# Patient Record
Sex: Male | Born: 1965
Health system: Southern US, Community
[De-identification: ages and names within clinical notes are randomized; demographics above are authoritative.]

## PROBLEM LIST (undated history)

## (undated) DIAGNOSIS — Z9889 Other specified postprocedural states: Secondary | ICD-10-CM

## (undated) DIAGNOSIS — D759 Disease of blood and blood-forming organs, unspecified: Secondary | ICD-10-CM

## (undated) DIAGNOSIS — R112 Nausea with vomiting, unspecified: Secondary | ICD-10-CM

## (undated) DIAGNOSIS — M1990 Unspecified osteoarthritis, unspecified site: Secondary | ICD-10-CM

## (undated) HISTORY — PX: ANKLE ARTHROSCOPY: SUR85

---

## 2004-05-13 ENCOUNTER — Emergency Department (HOSPITAL_COMMUNITY): Admission: AC | Admit: 2004-05-13 | Discharge: 2004-05-13 | Payer: Self-pay

## 2004-05-20 ENCOUNTER — Emergency Department (HOSPITAL_COMMUNITY): Admission: EM | Admit: 2004-05-20 | Discharge: 2004-05-20 | Payer: Self-pay | Admitting: Family Medicine

## 2009-11-28 ENCOUNTER — Encounter (INDEPENDENT_AMBULATORY_CARE_PROVIDER_SITE_OTHER): Payer: Self-pay | Admitting: *Deleted

## 2010-07-15 ENCOUNTER — Emergency Department (HOSPITAL_COMMUNITY)
Admission: EM | Admit: 2010-07-15 | Discharge: 2010-07-15 | Payer: Self-pay | Source: Home / Self Care | Admitting: Emergency Medicine

## 2010-08-22 NOTE — Letter (Signed)
Summary: New Patient letter  [facility]  [address] [place], [place] [address]   Phone: [phone]  Fax: [phone]       11/28/2009 MRN: [id number]  ERIC Cantero [address] [place], [place]  [address]  Dear Mr. [facility],  Welcome to the Gastroenterology Division at [facility].    You are scheduled to see Dr.  Christella Hartigan on 01-03-10 at 3pm on the 3rd floor at [facility], [address]  We ask that you try to arrive at our office 15 minutes prior to your appointment time to allow for check-in.  We would like you to complete the enclosed self-administered evaluation form prior to your visit and bring it with you on the day of your appointment.  We will review it with you.  Also, please bring a complete list of all your medications or, if you prefer, bring the medication bottles and we will list them.  Please bring your insurance card so that we may make a copy of it.  If your insurance requires a referral to see a specialist, please bring your referral form from your primary care physician.  Co-payments are due at the time of your visit and may be paid by cash, check or credit card.     Your office visit will consist of a consult with your physician (includes a physical exam), any laboratory testing he/she may order, scheduling of any necessary diagnostic testing (e.g. x-ray, ultrasound, CT-scan), and scheduling of a procedure (e.g. Endoscopy, Colonoscopy) if required.  Please allow enough time on your schedule to allow for any/all of these possibilities.    If you cannot keep your appointment, please call [phone] to cancel or reschedule prior to your appointment date.  This allows [place] the opportunity to schedule an appointment for another patient in need of care.  If you do not cancel or reschedule by 5 p.m. the business day prior to your appointment date, you will be charged a $50.00 late cancellation/no-show fee.    Thank you for choosing  [facility] for your medical needs.  We appreciate the opportunity to care for you.  Please visit [place] at our website  to learn more about our practice.                     Sincerely,                                                             The Gastroenterology Division

## 2010-12-19 ENCOUNTER — Other Ambulatory Visit (HOSPITAL_COMMUNITY): Payer: Self-pay | Admitting: Orthopaedic Surgery

## 2010-12-19 ENCOUNTER — Ambulatory Visit (HOSPITAL_COMMUNITY)
Admission: RE | Admit: 2010-12-19 | Discharge: 2010-12-19 | Disposition: A | Discharge status: Home / Self Care | Payer: 59 | Source: Ambulatory Visit | Attending: Orthopaedic Surgery | Admitting: Orthopaedic Surgery

## 2010-12-19 DIAGNOSIS — M19079 Primary osteoarthritis, unspecified ankle and foot: Secondary | ICD-10-CM | POA: Insufficient documentation

## 2010-12-19 DIAGNOSIS — R52 Pain, unspecified: Secondary | ICD-10-CM

## 2010-12-19 DIAGNOSIS — M25579 Pain in unspecified ankle and joints of unspecified foot: Secondary | ICD-10-CM | POA: Insufficient documentation

## 2011-07-10 ENCOUNTER — Encounter (HOSPITAL_COMMUNITY): Payer: Self-pay

## 2011-07-20 NOTE — Progress Notes (Signed)
Called Dr Victorino Dike for orders; in surgery today; will enter orders in Epic; is aware of   pat appt 12/31 @ 0900

## 2011-07-23 ENCOUNTER — Encounter (HOSPITAL_COMMUNITY)
Admission: RE | Admit: 2011-07-23 | Discharge: 2011-07-23 | Disposition: A | Discharge status: Home / Self Care | Payer: 59 | Source: Ambulatory Visit | Attending: Orthopedic Surgery | Admitting: Orthopedic Surgery

## 2011-07-23 ENCOUNTER — Encounter (HOSPITAL_COMMUNITY): Payer: Self-pay

## 2011-07-23 HISTORY — DX: Other specified postprocedural states: Z98.890

## 2011-07-23 HISTORY — DX: Nausea with vomiting, unspecified: R11.2

## 2011-07-23 HISTORY — DX: Disease of blood and blood-forming organs, unspecified: D75.9

## 2011-07-23 HISTORY — DX: Unspecified osteoarthritis, unspecified site: M19.90

## 2011-07-23 LAB — TYPE AND SCREEN: ABO/RH(D): B POS

## 2011-07-23 LAB — BASIC METABOLIC PANEL
BUN: 16 mg/dL (ref 6–23)
CO2: 27 mEq/L (ref 19–32)
Calcium: 8.8 mg/dL (ref 8.4–10.5)
Chloride: 105 mEq/L (ref 96–112)
Creatinine, Ser: 0.86 mg/dL (ref 0.50–1.35)
GFR calc Af Amer: >90 mL/min (ref >90)
GFR calc non Af Amer: >90 mL/min (ref >90)
Glucose, Bld: 97 mg/dL (ref 70–99)
Potassium: 4 mEq/L (ref 3.5–5.1)
Sodium: 140 mEq/L (ref 135–145)

## 2011-07-23 LAB — CBC
MCV: 82.1 fL (ref 78.0–100.0)
Platelets: 136 10*3/uL — ABNORMAL LOW (ref 150–400)
RBC: 4.48 MIL/uL (ref 4.22–5.81)
RDW: 13.5 % (ref 11.5–15.5)
WBC: 4.4 10*3/uL (ref 4.0–10.5)

## 2011-07-23 LAB — ABO/RH: ABO/RH(D): B POS

## 2011-07-23 LAB — SURGICAL PCR SCREEN
MRSA, PCR: NEGATIVE
Staphylococcus aureus: POSITIVE — AB

## 2011-07-23 MED ORDER — CHLORHEXIDINE GLUCONATE 4 % EX LIQD: 60.0000 mL | Freq: Once | CUTANEOUS | Status: DC

## 2011-07-23 NOTE — Pre-Procedure Instructions (Addendum)
20 RAMIL EDGINGTON  07/23/2011   Your procedure is scheduled on: 07/26/11  Report to [facility] [id number] AM.  Call this number if you have problems the morning of surgery: [phone]   Remember:   Do not eat food:After Midnight.  May have clear liquids: up to 4 Hours before arrival.  Clear liquids include soda, tea, black coffee, apple or grape juice, broth.  Take these medicines the morning of surgery with A SIP OF WATER: tylenol   Do not wear jewelry, make-up or nail polish.  Do not wear lotions, powders, or perfumes. You may wear deodorant.  Do not shave 48 hours prior to surgery.  Do not bring valuables to the hospital.  Contacts, dentures or bridgework may not be worn into surgery.  Leave suitcase in the car. After surgery it may be brought to your room.  For patients admitted to the hospital, checkout time is 11:00 AM the day of discharge.   Patients discharged the day of surgery will not be allowed to drive home.  Name and phone number of your driver: nhi [phone]  Special Instructions: CHG Shower Use Special Wash: 1/2 bottle night before surgery and 1/2 bottle morning of surgery.incentive spirometry instructions   Please read over the following fact sheets that you were given: Pain Booklet, Coughing and Deep Breathing, MRSA Information and Surgical Site Infection Prevention

## 2011-07-25 MED ORDER — CEFAZOLIN SODIUM-DEXTROSE 2-3 GM-% IV SOLR
2.0000 g | INTRAVENOUS | Status: AC
  Administered 2011-07-26: 2 g via INTRAVENOUS
  Filled 2011-07-25: qty 50

## 2011-07-26 ENCOUNTER — Ambulatory Visit (HOSPITAL_COMMUNITY): Payer: 59 | Admitting: Critical Care Medicine

## 2011-07-26 ENCOUNTER — Encounter (HOSPITAL_COMMUNITY): Payer: Self-pay | Admitting: *Deleted

## 2011-07-26 ENCOUNTER — Encounter (HOSPITAL_COMMUNITY): Payer: Self-pay | Admitting: Critical Care Medicine

## 2011-07-26 ENCOUNTER — Inpatient Hospital Stay (HOSPITAL_COMMUNITY)
Admission: RE | Admit: 2011-07-26 | Discharge: 2011-07-28 | DRG: 469 | Disposition: A | Discharge status: Home Health | Payer: 59 | Source: Ambulatory Visit | Attending: Orthopedic Surgery | Admitting: Orthopedic Surgery

## 2011-07-26 ENCOUNTER — Ambulatory Visit (HOSPITAL_COMMUNITY): Payer: 59

## 2011-07-26 ENCOUNTER — Encounter (HOSPITAL_COMMUNITY)
Admission: RE | Disposition: A | Discharge status: Home Health | Payer: Self-pay | Source: Ambulatory Visit | Attending: Orthopedic Surgery

## 2011-07-26 DIAGNOSIS — D66 Hereditary factor VIII deficiency: Secondary | ICD-10-CM | POA: Diagnosis present

## 2011-07-26 DIAGNOSIS — Z01812 Encounter for preprocedural laboratory examination: Secondary | ICD-10-CM

## 2011-07-26 DIAGNOSIS — M19079 Primary osteoarthritis, unspecified ankle and foot: Secondary | ICD-10-CM

## 2011-07-26 DIAGNOSIS — M12879 Other specific arthropathies, not elsewhere classified, unspecified ankle and foot: Principal | ICD-10-CM | POA: Diagnosis present

## 2011-07-26 DIAGNOSIS — Z79899 Other long term (current) drug therapy: Secondary | ICD-10-CM

## 2011-07-26 HISTORY — PX: TOTAL ANKLE ARTHROPLASTY: SHX811

## 2011-07-26 SURGERY — ARTHROPLASTY, ANKLE, TOTAL
Anesthesia: General | Site: Ankle | Laterality: Left | Wound class: Clean

## 2011-07-26 MED ORDER — SENNA 8.6 MG PO TABS
1.0000 | ORAL_TABLET | Freq: Two times a day (BID) | ORAL | Status: DC
  Administered 2011-07-26 – 2011-07-27 (×2): 8.6 mg via ORAL
  Filled 2011-07-26 (×5): qty 1

## 2011-07-26 MED ORDER — MORPHINE SULFATE 2 MG/ML IJ SOLN: 0.0500 mg/kg | INTRAMUSCULAR | Status: DC | PRN

## 2011-07-26 MED ORDER — HYDROMORPHONE HCL PF 1 MG/ML IJ SOLN
1.0000 mg | Freq: Two times a day (BID) | INTRAMUSCULAR | Status: DC | PRN
  Administered 2011-07-26: 0.5 mg via INTRAVENOUS
  Filled 2011-07-26 (×2): qty 1

## 2011-07-26 MED ORDER — PROPOFOL 10 MG/ML IV EMUL
INTRAVENOUS | Status: DC | PRN
  Administered 2011-07-26: 200 mg via INTRAVENOUS

## 2011-07-26 MED ORDER — ONDANSETRON HCL 4 MG PO TABS: 4.0000 mg | ORAL_TABLET | Freq: Four times a day (QID) | ORAL | Status: DC | PRN

## 2011-07-26 MED ORDER — SODIUM CHLORIDE 0.9 % IV SOLN: INTRAVENOUS | Status: DC

## 2011-07-26 MED ORDER — ZOLPIDEM TARTRATE 5 MG PO TABS
5.0000 mg | ORAL_TABLET | Freq: Every evening | ORAL | Status: DC | PRN
  Administered 2011-07-26: 5 mg via ORAL
  Filled 2011-07-26: qty 1

## 2011-07-26 MED ORDER — METOCLOPRAMIDE HCL 10 MG PO TABS
5.0000 mg | ORAL_TABLET | Freq: Three times a day (TID) | ORAL | Status: DC | PRN
  Administered 2011-07-27: 10 mg via ORAL
  Filled 2011-07-26: qty 1

## 2011-07-26 MED ORDER — SCOPOLAMINE 1 MG/3DAYS TD PT72
MEDICATED_PATCH | TRANSDERMAL | Status: DC | PRN
  Administered 2011-07-26: 1.5 mg via TRANSDERMAL

## 2011-07-26 MED ORDER — METHOCARBAMOL 500 MG PO TABS
500.0000 mg | ORAL_TABLET | Freq: Four times a day (QID) | ORAL | Status: DC | PRN
  Administered 2011-07-26 – 2011-07-28 (×3): 500 mg via ORAL
  Filled 2011-07-26 (×3): qty 1

## 2011-07-26 MED ORDER — ACETAMINOPHEN 10 MG/ML IV SOLN
INTRAVENOUS | Status: AC
  Filled 2011-07-26: qty 100

## 2011-07-26 MED ORDER — ROCURONIUM BROMIDE 100 MG/10ML IV SOLN
INTRAVENOUS | Status: DC | PRN
  Administered 2011-07-26: 50 mg via INTRAVENOUS

## 2011-07-26 MED ORDER — LIDOCAINE HCL (CARDIAC) 20 MG/ML IV SOLN
INTRAVENOUS | Status: DC | PRN
  Administered 2011-07-26: 60 mg via INTRAVENOUS

## 2011-07-26 MED ORDER — COAGULATION FACTOR IX (RECOMB) 250 UNITS IV SOLR
5148.0000 [IU] | Freq: Once | INTRAVENOUS | Status: AC
  Administered 2011-07-26: 5148 [IU] via INTRAVENOUS
  Filled 2011-07-26: qty 5148

## 2011-07-26 MED ORDER — VANCOMYCIN HCL 500 MG IV SOLR
500.0000 mg | INTRAVENOUS | Status: DC
  Filled 2011-07-26: qty 500

## 2011-07-26 MED ORDER — COAGULATION FACTOR IX (RECOMB) 250 UNITS IV SOLR
5000.0000 [IU] | Freq: Once | INTRAVENOUS | Status: DC
  Filled 2011-07-26: qty 5000

## 2011-07-26 MED ORDER — OXYCODONE HCL 5 MG PO TABS
5.0000 mg | ORAL_TABLET | ORAL | Status: DC | PRN
  Administered 2011-07-26 – 2011-07-28 (×4): 10 mg via ORAL
  Filled 2011-07-26 (×4): qty 2

## 2011-07-26 MED ORDER — ACETAMINOPHEN 10 MG/ML IV SOLN
INTRAVENOUS | Status: DC | PRN
  Administered 2011-07-26: 1000 mg via INTRAVENOUS

## 2011-07-26 MED ORDER — VECURONIUM BROMIDE 10 MG IV SOLR
INTRAVENOUS | Status: DC | PRN
  Administered 2011-07-26 (×2): 2 mg via INTRAVENOUS

## 2011-07-26 MED ORDER — DOCUSATE SODIUM 100 MG PO CAPS
100.0000 mg | ORAL_CAPSULE | Freq: Two times a day (BID) | ORAL | Status: DC
  Administered 2011-07-26 – 2011-07-28 (×3): 100 mg via ORAL
  Filled 2011-07-26 (×6): qty 1

## 2011-07-26 MED ORDER — DEXAMETHASONE SODIUM PHOSPHATE 4 MG/ML IJ SOLN
INTRAMUSCULAR | Status: DC | PRN
  Administered 2011-07-26: 4 mg via INTRAVENOUS

## 2011-07-26 MED ORDER — OXYCODONE HCL 20 MG PO TB12
20.0000 mg | ORAL_TABLET | Freq: Two times a day (BID) | ORAL | Status: DC
  Administered 2011-07-26 – 2011-07-28 (×3): 20 mg via ORAL
  Filled 2011-07-26 (×3): qty 1

## 2011-07-26 MED ORDER — MIDAZOLAM HCL 2 MG/2ML IJ SOLN: 0.5000 mg | Freq: Once | INTRAMUSCULAR | Status: DC | PRN

## 2011-07-26 MED ORDER — NEOSTIGMINE METHYLSULFATE 1 MG/ML IJ SOLN
INTRAMUSCULAR | Status: DC | PRN
  Administered 2011-07-26: 5 mg via INTRAVENOUS

## 2011-07-26 MED ORDER — MEPERIDINE HCL 25 MG/ML IJ SOLN: 6.2500 mg | INTRAMUSCULAR | Status: DC | PRN

## 2011-07-26 MED ORDER — FENTANYL CITRATE 0.05 MG/ML IJ SOLN
INTRAMUSCULAR | Status: DC | PRN
  Administered 2011-07-26 (×6): 50 ug via INTRAVENOUS
  Administered 2011-07-26 (×2): 100 ug via INTRAVENOUS
  Administered 2011-07-26: 50 ug via INTRAVENOUS
  Administered 2011-07-26: 100 ug via INTRAVENOUS

## 2011-07-26 MED ORDER — VANCOMYCIN HCL 500 MG IV SOLR
INTRAVENOUS | Status: DC | PRN
  Administered 2011-07-26: 500 mg via TOPICAL

## 2011-07-26 MED ORDER — MIDAZOLAM HCL 5 MG/5ML IJ SOLN
INTRAMUSCULAR | Status: DC | PRN
  Administered 2011-07-26: 2 mg via INTRAVENOUS

## 2011-07-26 MED ORDER — SODIUM CHLORIDE 0.9 % IV SOLN
INTRAVENOUS | Status: DC
  Administered 2011-07-26 – 2011-07-28 (×2): via INTRAVENOUS

## 2011-07-26 MED ORDER — HYDROMORPHONE HCL PF 1 MG/ML IJ SOLN
0.2500 mg | INTRAMUSCULAR | Status: DC | PRN
Start: 2011-07-26 — End: 2011-07-26
  Administered 2011-07-26: 0.5 mg via INTRAVENOUS

## 2011-07-26 MED ORDER — PROMETHAZINE HCL 25 MG/ML IJ SOLN: 6.2500 mg | INTRAMUSCULAR | Status: DC | PRN

## 2011-07-26 MED ORDER — 0.9 % SODIUM CHLORIDE (POUR BTL) OPTIME
TOPICAL | Status: DC | PRN
  Administered 2011-07-26: 1000 mL

## 2011-07-26 MED ORDER — ONDANSETRON HCL 4 MG/2ML IJ SOLN
INTRAMUSCULAR | Status: DC | PRN
  Administered 2011-07-26 (×2): 4 mg via INTRAVENOUS

## 2011-07-26 MED ORDER — COAGULATION FACTOR IX (RECOMB) 250 UNITS IV SOLR
5000.0000 [IU] | Freq: Two times a day (BID) | INTRAVENOUS | Status: DC
  Administered 2011-07-27: 5000 [IU] via INTRAVENOUS
  Filled 2011-07-26 (×5): qty 5000

## 2011-07-26 MED ORDER — METOCLOPRAMIDE HCL 5 MG/ML IJ SOLN
5.0000 mg | Freq: Three times a day (TID) | INTRAMUSCULAR | Status: DC | PRN
  Filled 2011-07-26: qty 2

## 2011-07-26 MED ORDER — ONDANSETRON HCL 4 MG/2ML IJ SOLN
4.0000 mg | Freq: Four times a day (QID) | INTRAMUSCULAR | Status: DC | PRN
  Administered 2011-07-27: 4 mg via INTRAVENOUS
  Filled 2011-07-26: qty 2

## 2011-07-26 MED ORDER — LACTATED RINGERS IV SOLN
INTRAVENOUS | Status: DC
  Administered 2011-07-26 (×2): via INTRAVENOUS

## 2011-07-26 MED ORDER — DROPERIDOL 2.5 MG/ML IJ SOLN
INTRAMUSCULAR | Status: DC | PRN
  Administered 2011-07-26: 0.625 mg via INTRAVENOUS

## 2011-07-26 MED ORDER — ACETAMINOPHEN 325 MG PO TABS: 650.0000 mg | ORAL_TABLET | ORAL | Status: DC | PRN

## 2011-07-26 MED ORDER — GLYCOPYRROLATE 0.2 MG/ML IJ SOLN
INTRAMUSCULAR | Status: DC | PRN
  Administered 2011-07-26: .8 mg via INTRAVENOUS

## 2011-07-26 MED ORDER — METHOCARBAMOL 100 MG/ML IJ SOLN
500.0000 mg | Freq: Four times a day (QID) | INTRAVENOUS | Status: DC | PRN
  Filled 2011-07-26: qty 5

## 2011-07-26 SURGICAL SUPPLY — 62 items
BANDAGE ESMARK 6X9 LF (GAUZE/BANDAGES/DRESSINGS) ×1 IMPLANT
BLADE LONG MED 31X9 (MISCELLANEOUS) ×2 IMPLANT
BLADE RECIPRO TAPERED (BLADE) ×2 IMPLANT
BLADE SURG 15 STRL LF DISP TIS (BLADE) ×2 IMPLANT
BLADE SURG 15 STRL SS (BLADE) ×4
BNDG CMPR 9X6 STRL LF SNTH (GAUZE/BANDAGES/DRESSINGS) ×1
BNDG ESMARK 6X9 LF (GAUZE/BANDAGES/DRESSINGS) ×2
CHLORAPREP W/TINT 26ML (MISCELLANEOUS) ×2 IMPLANT
CLOTH BEACON ORANGE TIMEOUT ST (SAFETY) ×2 IMPLANT
CORE SLIDING STAR SZ 9MM (Orthopedic Implant) ×1 IMPLANT
COVER SURGICAL LIGHT HANDLE (MISCELLANEOUS) ×2 IMPLANT
CUFF TOURNIQUET SINGLE 34IN LL (TOURNIQUET CUFF) ×2 IMPLANT
CUFF TOURNIQUET SINGLE 44IN (TOURNIQUET CUFF) IMPLANT
DRAPE C-ARM 42X72 X-RAY (DRAPES) ×2 IMPLANT
DRAPE C-ARMOR (DRAPES) ×1 IMPLANT
DRAPE EXTREMITY T 121X128X90 (DRAPE) ×2 IMPLANT
DRAPE ORTHO SPLIT 77X108 STRL (DRAPES) ×2
DRAPE SURG ORHT 6 SPLT 77X108 (DRAPES) ×1 IMPLANT
DRAPE U-SHAPE 47X51 STRL (DRAPES) ×2 IMPLANT
DRSG ADAPTIC 3X8 NADH LF (GAUZE/BANDAGES/DRESSINGS) ×2 IMPLANT
DRSG MEPILEX BORDER 4X4 (GAUZE/BANDAGES/DRESSINGS) ×2 IMPLANT
DRSG PAD ABDOMINAL 8X10 ST (GAUZE/BANDAGES/DRESSINGS) ×6 IMPLANT
ELECT REM PT RETURN 9FT ADLT (ELECTROSURGICAL) ×2
ELECTRODE REM PT RTRN 9FT ADLT (ELECTROSURGICAL) ×1 IMPLANT
EVACUATOR 1/8 PVC DRAIN (DRAIN) ×2 IMPLANT
GAUZE SPONGE 4X4 12PLY STRL LF (GAUZE/BANDAGES/DRESSINGS) ×1 IMPLANT
GEL ULTRASOUND 20GR AQUASONIC (MISCELLANEOUS) ×2 IMPLANT
GLOVE BIO SURGEON STRL SZ7 (GLOVE) ×2 IMPLANT
GLOVE BIO SURGEON STRL SZ8 (GLOVE) ×2 IMPLANT
GLOVE BIOGEL PI IND STRL 6.5 (GLOVE) IMPLANT
GLOVE BIOGEL PI IND STRL 8 (GLOVE) ×1 IMPLANT
GLOVE BIOGEL PI INDICATOR 6.5 (GLOVE) ×1
GLOVE BIOGEL PI INDICATOR 8 (GLOVE) ×1
GLOVE ORTHO TXT STRL SZ7.5 (GLOVE) ×1 IMPLANT
GOWN PREVENTION PLUS XLARGE (GOWN DISPOSABLE) ×2 IMPLANT
GOWN STRL NON-REIN LRG LVL3 (GOWN DISPOSABLE) ×4 IMPLANT
IMPLANT TALAR STAR SZ XS LF (Orthopedic Implant) ×1 IMPLANT
IMPLANT TIBIAL STAR SZ L (Orthopedic Implant) ×1 IMPLANT
KIT BASIN OR (CUSTOM PROCEDURE TRAY) ×2 IMPLANT
KIT ROOM TURNOVER OR (KITS) ×2 IMPLANT
MANIFOLD NEPTUNE II (INSTRUMENTS) ×2 IMPLANT
NS IRRIG 1000ML POUR BTL (IV SOLUTION) ×2 IMPLANT
PACK TOTAL JOINT (CUSTOM PROCEDURE TRAY) ×2 IMPLANT
PAD ARMBOARD 7.5X6 YLW CONV (MISCELLANEOUS) ×4 IMPLANT
PAD CAST 4YDX4 CTTN HI CHSV (CAST SUPPLIES) ×3 IMPLANT
PADDING CAST COTTON 4X4 STRL (CAST SUPPLIES) ×6
PADDING WEBRIL 4 STERILE (GAUZE/BANDAGES/DRESSINGS) ×1 IMPLANT
PADDING WEBRIL 6 STERILE (GAUZE/BANDAGES/DRESSINGS) ×1 IMPLANT
SPLINT FIBERGLASS 5X30 (CAST SUPPLIES) ×1 IMPLANT
SPONGE GAUZE 4X4 12PLY (GAUZE/BANDAGES/DRESSINGS) ×4 IMPLANT
SUCTION FRAZIER TIP 10 FR DISP (SUCTIONS) ×2 IMPLANT
SUT MNCRL 3 0 RB1 (SUTURE) ×2 IMPLANT
SUT MONOCRYL 3 0 RB1 (SUTURE) ×2
SUT PROLENE 3 0 PS 2 (SUTURE) ×3 IMPLANT
SUT VIC AB 0 CT1 27 (SUTURE) ×2
SUT VIC AB 0 CT1 27XBRD ANBCTR (SUTURE) ×1 IMPLANT
SUT VIC AB 2-0 CT1 27 (SUTURE) ×2
SUT VIC AB 2-0 CT1 TAPERPNT 27 (SUTURE) ×2 IMPLANT
SUT VICRYL 4-0 PS2 18IN ABS (SUTURE) ×1 IMPLANT
TOWEL OR 17X24 6PK STRL BLUE (TOWEL DISPOSABLE) ×2 IMPLANT
TOWEL OR 17X26 10 PK STRL BLUE (TOWEL DISPOSABLE) ×2 IMPLANT
WATER STERILE IRR 1000ML POUR (IV SOLUTION) ×1 IMPLANT

## 2011-07-26 NOTE — Anesthesia Postprocedure Evaluation (Signed)
  Anesthesia Post-op Note  Patient: [name]  Procedure(s) Performed:  TOTAL ANKLE ARTHOPLASTY - Left Total Ankle Replacement   Patient Location: PACU  Anesthesia Type: General  Level of Consciousness: awake and alert   Airway and Oxygen Therapy: Patient Spontanous Breathing  Post-op Pain: mild  Post-op Assessment: Post-op Vital signs reviewed, Patient's Cardiovascular Status Stable and Respiratory Function Stable  Post-op Vital Signs: stable  Complications: No apparent anesthesia complications

## 2011-07-26 NOTE — Brief Op Note (Signed)
07/26/2011  3:51 PM  PATIENT:  [name]  45 y.o. male  PRE-OPERATIVE DIAGNOSIS:  left ankle arthritis  POST-OPERATIVE DIAGNOSIS:  left ankle arthritis  Procedure(s): 1.  Left TOTAL ANKLE ARTHOPLASTY 2.  Fluoro > 1 hr  SURGEON:  Toni Arthurs, MD  ASSISTANT: n/a  ANESTHESIA:   General  EBL: minimal  Implants:  STAR components:  Size L tibia, size XS talus, 9 mm poly.  TOURNIQUET:   Total Tourniquet Time Documented: Thigh (Left) - 135 minutes  COMPLICATIONS:  None apparent  DISPOSITION:  Extubated, awake and stable to recovery.  DICTATION ID:  [id number]

## 2011-07-26 NOTE — Anesthesia Preprocedure Evaluation (Addendum)
Anesthesia Evaluation  Patient identified by MRN, date of birth, ID band Patient awake    Reviewed: Allergy & Precautions, H&P , NPO status , Patient's Chart, lab work & pertinent test results  History of Anesthesia Complications (+) PONV  Airway Mallampati: II TM Distance: >3 FB Neck ROM: Full    Dental  (+) Teeth Intact and Dental Advisory Given   Pulmonary neg pulmonary ROS,  clear to auscultation        Cardiovascular neg cardio ROS Normal    Neuro/Psych Negative Neurological ROS     GI/Hepatic negative GI ROS, Neg liver ROS,   Endo/Other    Renal/GU negative Renal ROS     Musculoskeletal   Abdominal   Peds  Hematology Factor 9 deficiency   Anesthesia Other Findings   Reproductive/Obstetrics                        Anesthesia Physical Anesthesia Plan  ASA: II  Anesthesia Plan: General   Post-op Pain Management:    Induction: Intravenous  Airway Management Planned: Oral ETT  Additional Equipment:   Intra-op Plan:   Post-operative Plan: Extubation in OR  Informed Consent: I have reviewed the patients History and Physical, chart, labs and discussed the procedure including the risks, benefits and alternatives for the proposed anesthesia with the patient or authorized representative who has indicated his/her understanding and acceptance.   Dental advisory given  Plan Discussed with: CRNA, Anesthesiologist and Surgeon  Anesthesia Plan Comments:         Anesthesia Quick Evaluation

## 2011-07-26 NOTE — Anesthesia Procedure Notes (Signed)
Procedure Name: Intubation Date/Time: 07/26/2011 1:07 PM Performed by: Elon Alas Pre-anesthesia Checklist: Timeout performed, Patient identified, Emergency Drugs available, Suction available and Patient being monitored Patient Re-evaluated:Patient Re-evaluated prior to inductionOxygen Delivery Method: Circle System Utilized Preoxygenation: Pre-oxygenation with 100% oxygen Intubation Type: IV induction and Cricoid Pressure applied Ventilation: Mask ventilation without difficulty and Oral airway inserted - appropriate to patient size Laryngoscope Size: Mac and 4 Grade View: Grade II Tube type: Oral Tube size: 7.5 mm Number of attempts: 1 Airway Equipment and Method: stylet Placement Confirmation: ETT inserted through vocal cords under direct vision,  positive ETCO2 and breath sounds checked- equal and bilateral Secured at: 22 cm Tube secured with: Tape Dental Injury: Teeth and Oropharynx as per pre-operative assessment

## 2011-07-26 NOTE — H&P (Signed)
[name] is an 46 y.o. male.   Chief Complaint: left ankle pain HPI: 46 y/o male with pmh of hemophilia (factor 9 deficiency) presents with a long h/o left ankle pain due to arthritis.  He presents now for L total ankle replacement.  Past Medical History  Diagnosis Date  . PONV (postoperative nausea and vomiting)   . Blood dyscrasia     hemophiliac  . Arthritis     Past Surgical History  Procedure Date  . Ankle arthroscopy     lft ,rt x2    History reviewed. No pertinent family history. Social History:  reports that he has never smoked. He does not have any smokeless tobacco history on file. He reports that he does not drink alcohol or use illicit drugs.  Allergies: No Known Allergies  Medications Prior to Admission  Medication Dose Route Frequency Provider Last Rate Last Dose  . 0.9 %  sodium chloride infusion   Intravenous Continuous Toni Arthurs, MD      . ceFAZolin (ANCEF) IVPB 2 g/50 mL premix  2 g Intravenous 60 min Pre-Op Toni Arthurs, MD      . coagulation factor IX (recomb) (BENEFIX) injection 5,148 Units  5,148 Units Intravenous Once Sherron Monday, PHARMD      . lactated ringers infusion   Intravenous Continuous Judie Petit, MD 50 mL/hr at 08-12-11 1133    . vancomycin (VANCOCIN) powder 500 mg  500 mg Other To OR Toni Arthurs, MD      . DISCONTD: coagulation factor IX (recomb) (BENEFIX) injection 5,000 Units  5,000 Units Intravenous Once Toni Arthurs, MD       No current outpatient prescriptions on file as of 08/12/11.    No results found for this or any previous visit (from the past 48 hour(s)). No results found.  ROS  No recent f/c/n/v/wt loss.  Blood pressure 127/74, pulse 72, temperature 98.1 F (36.7 C), temperature source Oral, resp. rate 18, SpO2 98.00%. Physical Exam  wn wd male in nad.  A and O x 4.  Mood and affect normal. EOMI.  Respirations unlabored.  L ankle with small effusion and swelling.  Nl sens to LT.  Palpable pulses.  5/5  strength in PF and DF.  No lymphadenopathy.  xrays show normal alignment but end stage arthritis.  Assessment/Plan L ankle arthritis - to OR for L total ankle replacement  Hemophilia - BeneFix 5000 units pre op 30 min and then q12 x 3 doses post op per hematologist.  The risks and benefits of the alternative treatment options have been discussed in detail.  The patient wishes to proceed with surgery and specifically understands risks of bleeding, infection, nerve damage, blood clots, need for additional surgery, amputation and death.   Toni Arthurs 12-Aug-2011, 12:35 PM

## 2011-07-26 NOTE — Transfer of Care (Signed)
Immediate Anesthesia Transfer of Care Note  Patient: [name]  Procedure(s) Performed:  TOTAL ANKLE ARTHOPLASTY - Left Total Ankle Replacement   Patient Location: PACU  Anesthesia Type: General  Level of Consciousness: sedated  Airway & Oxygen Therapy: Patient connected to nasal cannula oxygen  Post-op Assessment: Report given to PACU RN, Post -op Vital signs reviewed and stable and Patient moving all extremities X 4  Post vital signs: Reviewed and stable  Complications: No apparent anesthesia complications

## 2011-07-27 ENCOUNTER — Encounter (HOSPITAL_COMMUNITY): Payer: Self-pay | Admitting: Orthopedic Surgery

## 2011-07-27 MED ORDER — HYDROMORPHONE HCL PF 1 MG/ML IJ SOLN
1.0000 mg | INTRAMUSCULAR | Status: DC | PRN
  Administered 2011-07-27 (×3): 1 mg via INTRAVENOUS
  Filled 2011-07-27 (×2): qty 1

## 2011-07-27 MED ORDER — HYDROMORPHONE HCL PF 1 MG/ML IJ SOLN
1.0000 mg | INTRAMUSCULAR | Status: DC | PRN
  Administered 2011-07-27 – 2011-07-28 (×6): 1 mg via INTRAVENOUS
  Filled 2011-07-27 (×7): qty 1

## 2011-07-27 MED ORDER — COAGULATION FACTOR IX (RECOMB) 250 UNITS IV SOLR
5250.0000 [IU] | Freq: Two times a day (BID) | INTRAVENOUS | Status: AC
  Administered 2011-07-28 (×2): 5250 [IU] via INTRAVENOUS
  Filled 2011-07-27 (×4): qty 5250

## 2011-07-27 MED ORDER — SODIUM CHLORIDE 0.9 % IJ SOLN: 10.0000 mL | INTRAMUSCULAR | Status: DC | PRN

## 2011-07-27 MED ORDER — COAGULATION FACTOR IX (RECOMB) 250 UNITS IV SOLR
5041.0000 [IU] | Freq: Two times a day (BID) | INTRAVENOUS | Status: DC
  Administered 2011-07-27: 5041 [IU] via INTRAVENOUS
  Filled 2011-07-27 (×4): qty 5041

## 2011-07-27 NOTE — Progress Notes (Signed)
Physical Therapy Evaluation Patient Details Name: [name] MRN: [id number] DOB: [birth year] Today's Date: 07/27/2011  Problem List: There is no problem list on file for this patient.   Past Medical History:  Past Medical History  Diagnosis Date  . PONV (postoperative nausea and vomiting)   . Blood dyscrasia     hemophiliac  . Arthritis    Past Surgical History:  Past Surgical History  Procedure Date  . Ankle arthroscopy     lft ,rt x2  . Total ankle arthroplasty 07/26/2011    Procedure: TOTAL ANKLE ARTHOPLASTY;  Surgeon: Toni Arthurs, MD;  Location: [facility] OR;  Service: Orthopedics;  Laterality: Left;  Left Total Ankle Replacement     PT Assessment/Plan/Recommendation PT Assessment Clinical Impression Statement: Pt presents with a medical diagnosis of  left total ankle replacement along with the following impairments/deficits and therapy diagnosis listed below. pt will benefit from skilled PT in the acute care setting in order to maximize functional mobility for a safe d/c home PT Recommendation/Assessment: Patient will need skilled PT in the acute care venue PT Problem List: Decreased strength;Decreased range of motion;Decreased activity tolerance;Decreased mobility;Decreased knowledge of use of DME;Decreased knowledge of precautions;Pain PT Therapy Diagnosis : Difficulty walking;Acute pain PT Plan PT Frequency: Min 5X/week PT Treatment/Interventions: DME instruction;Gait training;Stair training;Functional mobility training;Therapeutic activities;Therapeutic exercise;Patient/family education PT Recommendation Follow Up Recommendations: Home health PT;24 hour supervision/assistance Equipment Recommended: Rolling walker with 5" wheels PT Goals  Acute Rehab PT Goals PT Goal Formulation: With patient Time For Goal Achievement: 7 days Pt will go Supine/Side to Sit: with modified independence PT Goal: Supine/Side to Sit - Progress: Progressing toward goal Pt will go Sit to  Supine/Side: with modified independence PT Goal: Sit to Supine/Side - Progress: Progressing toward goal Pt will go Sit to Stand: with modified independence PT Goal: Sit to Stand - Progress: Progressing toward goal Pt will go Stand to Sit: with modified independence PT Goal: Stand to Sit - Progress: Progressing toward goal Pt will Ambulate: 16 - 50 feet;with supervision;with least restrictive assistive device PT Goal: Ambulate - Progress: Progressing toward goal Pt will Go Up / Down Stairs: 1-2 stairs;with min assist;with rolling walker PT Goal: Up/Down Stairs - Progress: Not met  PT Evaluation Precautions/Restrictions  Restrictions Weight Bearing Restrictions: Yes LLE Weight Bearing: Non weight bearing Prior Functioning  Home Living Lives With: Spouse Receives Help From: Family Type of Home: House Home Layout: One level Home Access: Stairs to enter Entrance Stairs-Rails: None Entrance Stairs-Number of Steps: 1 Bathroom Shower/Tub: Forensic scientist: Standard Bathroom Accessibility: Yes How Accessible: Accessible via walker Home Adaptive Equipment: Shower chair with back Prior Function Level of Independence: Independent with basic ADLs;Independent with gait;Independent with transfers;Independent with homemaking with ambulation Able to Take Stairs?: Yes Driving: Yes Vocation: Full time employment Cognition Cognition Arousal/Alertness: Awake/alert Overall Cognitive Status: Appears within functional limits for tasks assessed Orientation Level: Oriented X4 Sensation/Coordination Sensation Light Touch: Appears Intact Extremity Assessment RLE Assessment RLE Assessment: Within Functional Limits LLE Assessment LLE Assessment: Exceptions to WFL LLE AROM (degrees) Overall AROM Left Lower Extremity: Deficits;Due to pain;Due to precautions (Hip and Knee WFL; Ankle in cast) LLE Strength LLE Overall Strength: Deficits;Due to pain;Due to precautions (Hip and  Knee WFL; Ankle in cast) Mobility (including Balance) Bed Mobility Bed Mobility: Yes Supine to Sit: 4: Min assist;HOB elevated (Comment degrees) (40) Supine to Sit Details (indicate cue type and reason): VC for hand placement. Min assist for assist with LLE into sitting Sitting - Scoot  to Edge of Bed: 6: Modified independent (Device/Increase time) Sit to Supine - Right: 5: Supervision Sit to Supine - Right Details (indicate cue type and reason): VC for technique Transfers Transfers: Yes Sit to Stand: With upper extremity assist;From bed;3: Mod assist Sit to Stand Details (indicate cue type and reason): Assist to stabilize pts BOS anteriorly in order to assist with standing. Once pt in standing, he was able to stabilize on RW Stand to Sit: 4: Min assist;1: +2 Total assist;With upper extremity assist;To bed Stand to Sit Details: VC for hand placement and safety with RW Ambulation/Gait Ambulation/Gait: Yes Ambulation/Gait Assistance: 4: Min assist Ambulation/Gait Assistance Details (indicate cue type and reason): Min assist for safety secondary to balance with hopping on one LE. (Pt unable to complete with crutches) Ambulation Distance (Feet): 20 Feet Assistive device: Rolling walker Gait Pattern: Step-to pattern;Decreased step length - right Gait velocity: Decreased gait speed    Exercise    End of Session PT - End of Session Equipment Utilized During Treatment: Gait belt Patient left: in bed;with call bell in reach;with family/visitor present Nurse Communication: Mobility status for transfers;Mobility status for ambulation General Behavior During Session: [facility] for tasks performed Cognition: [facility] for tasks performed  Milana Kidney 07/27/2011, 2:39 PM  07/27/2011 Milana Kidney DPT PAGER: [phone] OFFICE: [phone]

## 2011-07-27 NOTE — Progress Notes (Signed)
Subjective: 1 Day Post-Op Procedure(s) (LRB): TOTAL ANKLE ARTHOPLASTY (Left) Patient reports pain as moderate.  Intermittently. No n/v.  Dilaudid effective for breakthrough pain.  Objective: Vital signs in last 24 hours: Temp:  [97.8 F (36.6 C)-98.9 F (37.2 C)] 98.4 F (36.9 C) (01/04 1610) Pulse Rate:  [71-99] 80  (01/04 0638) Resp:  [9-25] 20  (01/04 0638) BP: (127-157)/(74-88) 128/79 mmHg (01/04 0638) SpO2:  [96 %-100 %] 96 % (01/04 0638)  Intake/Output from previous day: 01/03 0701 - 01/04 0700 In: 1700 [I.V.:1600] Out: 500 [Urine:500] Intake/Output this shift:      cast intact.  wiggles toes.  brisk cap refill.  drain with serosang output of a few ccs.  no output recorded otherwise.  Assessment/Plan: 1 Day Post-Op Procedure(s) (LRB): TOTAL ANKLE ARTHOPLASTY (Left) Up with therapy Continue BeneFix 5000 units every 12 hours through noon tomorrow. PICC line today.  Toni Arthurs 07/27/2011, 7:40 AM

## 2011-07-27 NOTE — Op Note (Signed)
NAMEKAGEN, KUNATH               ACCOUNT NO.:  [id number]  MEDICAL RECORD NO.:  [id number]  LOCATION:  5004                         FACILITY:  [facility]  PHYSICIAN:  Toni Arthurs, MD        DATE OF BIRTH:  [birth year]  DATE OF PROCEDURE:  07/26/2011 DATE OF DISCHARGE:                              OPERATIVE REPORT   PREOPERATIVE DIAGNOSIS:  Left ankle arthritis.  POSTOPERATIVE DIAGNOSIS:  Left ankle arthritis.  PROCEDURE: 1. Left total ankle arthroplasty. 2. Intraoperative interpretation of fluoroscopic images greater than 1     hour.  SURGEON:  Toni Arthurs, MD  ANESTHESIA:  General.  IV FLUIDS:  See anesthesia record.  ESTIMATED BLOOD LOSS:  Minimal.  TOURNIQUET TIME:  Two hours and 15 minutes at 250 mmHg.  COMPLICATIONS:  None apparent.  DISPOSITION:  Extubated awake and stable to recovery.  IMPLANTS:  Size large tibia, size extra small talus, 9 mm poly.  Start total ankle replacement.  INDICATIONS FOR PROCEDURE:  The patient is a 46 year old male with a past medical history significant for hemophilia.  He has bilateral ankle arthritis with the left more symptomatic than the right currently.  He has failed nonoperative treatment including activity modification, oral anti-inflammatories, bracing, and steroid injections.  He presents now for left total ankle replacement.  He understands the risks and benefits of the various treatment options and elects to proceed with surgical treatment.  He specifically understands risks of bleeding, infection, nerve damage, blood clots, need for additional surgery, amputation, and death.  PROCEDURE IN DETAIL:  After preoperative consent was obtained, the correct operative site was identified.  The patient was brought to the operating room and placed supine on the operating table.  General anesthesia was induced.  Preoperative antibiotics were administered. Surgical time-out was taken.  Left lower extremity was prepped and draped in  standard sterile fashion with a tourniquet around the thigh. Prior to surgery, 5000 units of factor IX were administered IV approximately 45 minutes prior to incision time.  An anterior incision was marked over the ankle.  The extremity was exsanguinated and the tourniquet was inflated to 250 mmHg.  The anterior incision was made.  Sharp dissection was carried down through the skin and subcutaneous tissue to the level of the extensor retinaculum taking care to protect the crossing branches of the superficial peroneal nerve. The extensor retinaculum was incised over the EHL tendon.  It was released proximally and distally.  The EHL on the neurovascular bundle were mobilized and retracted laterally while the tibialis anterior was retracted medially.  The anterior joint capsule was noted to be quite thickened and redundant.  This was excised exposing the significant osteophytes over the anterior aspect of the ankle.  These were resected with a rongeur.  The stab incision was made of the tibial tubercle and the 3.2 mm guide pin was inserted in line with an osteotome in the medial gutter.  The external alignment guide was placed on the pin and applied to the anterior aspect of the ankle.  AP and lateral views were obtained, confirming appropriate alignment of the guide along the tibial shaft.  The rotation of the distal guide was set  using a quarter-inch osteotome in the medial gutter parallel with the T-handle guide.  The proximal end of the cutting block was pinned to the tibia.  The height of the tibial cut was set using the Angel wing in the lateral plane. The cutting block was then pinned into position.  The medial lateral position of the block was confirmed under AP image to avoid notching into the medial malleolus.  The cutting block was then pinned into position and a sagittal saw was used to cut the tibia distally.  The medial gutter was cut.  The guide was removed.  The cut was  completed and the morcellized fragments of bone were removed using a rongeur and curette.  The talar cutting guide was then applied.  All the cartilage was resected from the superior dome of the talus and the talus was reduced against the guide and pinned into position.  Appropriate position of the guide was confirmed on the lateral view.  The talar guide was then pinned into position and the talar dome cut was made with a sagittal saw.  The bone fragments were removed.  The sizing guide was then applied and the talus was determined to be an extra small.  The datum location was marked with a guide pin and a size small datum was applied.  The anterior-posterior cutting guide was then applied and the anterior and posterior cuts were made.  All the excess bone was removed. The small datum was removed and an extra-small medial lateral cutting guide was applied and pinned into position.  The medial lateral cuts were made with the reciprocating saw and all the excess bone was removed with a quarter-inch osteotome.  The talus window trial was then applied and was noted to fit appropriately.  It was pinned into position and the keel hole was drilled and punched.  The guide was removed along with the pins.  The wound was irrigated copiously.  A small amount of vancomycin powder was placed on the cut surface of the talus and the size extra- small talar component was impacted into position.  The tibial cut was measured and a size large base plate was put in as a trial.  An 8 mm poly was easily inserted and AP and lateral views were used to confirm the appropriate size and position of the tibial plate.  This was pinned into position.  The 2 barrel holes were drilled and the fin cuts were made.  The trial components were removed.  The wound was again irrigated copiously removing all fragments of bone.  The size large tibial base plate was inserted and impacted into position.  A size 9 mm trial  poly was inserted.  AP and lateral views showed appropriate position of the poly and appropriate size.  Prior to inserting the talar component, the medial and lateral gutters were cleaned of all scar tissue.  The trial poly was removed and 9 mm polyethylene spacer was inserted.  Final AP and lateral views were obtained showing appropriate position and size of all components.  Again the wound was irrigated copiously.  The 2 anterior holes were bone grafted.  The anterior joint capsule was repaired with 0 Vicryl sutures.  A medium Hemovac drain was placed deep to the retinaculum.  The extensor retinaculum was then repaired with 0 Vicryl simple and figure-of-eight sutures.  Subcutaneous tissue was approximated with inverted simple sutures of 3-0 Monocryl.  A running 3- 0 Prolene suture was used to close the skin  incision.  Sterile dressings were applied followed by a well-padded short-leg cast.  Tourniquet was released at 2 hours and 15 minutes after application of the cast.  The patient was then awakened from anesthesia and transported to the recovery room in stable condition.  FOLLOWUP PLAN:  The patient will be admitted to the hospital to continue receiving factor IX.  He will have a PICC line placed for home administration of factor IX.  He will follow up with me in 3 weeks after discharge on Saturday.     Toni Arthurs, MD     JH/MEDQ  D:  07/26/2011  T:  07/27/2011  Job:  [id number]

## 2011-07-27 NOTE — Progress Notes (Signed)
Utilization review completed. Minna Dumire, RN, BSN. 07/27/11  

## 2011-07-28 MED ORDER — HYDROMORPHONE HCL 2 MG PO TABS
2.0000 mg | ORAL_TABLET | ORAL | Status: DC | PRN
  Administered 2011-07-28: 4 mg via ORAL
  Filled 2011-07-28: qty 2

## 2011-07-28 MED ORDER — COAGULATION FACTOR IX (RECOMB) 250 UNITS IV SOLR: 5250.0000 [IU] | INTRAVENOUS | Status: AC

## 2011-07-28 MED ORDER — DSS 100 MG PO CAPS: 100.0000 mg | ORAL_CAPSULE | Freq: Two times a day (BID) | ORAL | Status: AC

## 2011-07-28 MED ORDER — HYDROMORPHONE HCL 2 MG PO TABS: 2.0000 mg | ORAL_TABLET | ORAL | Status: AC | PRN

## 2011-07-28 MED ORDER — SENNA 8.6 MG PO TABS: 2.0000 | ORAL_TABLET | Freq: Two times a day (BID) | ORAL | Status: DC

## 2011-07-28 MED ORDER — OXYCODONE HCL 10 MG PO TB12: ORAL_TABLET | ORAL | Status: DC

## 2011-07-28 NOTE — Progress Notes (Signed)
   CARE MANAGEMENT NOTE 07/28/2011  Patient:  [name], [name]   Account Number:  [id number]  Date Initiated:  07/27/2011  Documentation initiated by:  Vance Peper  Subjective/Objective Assessment:   46 yr old male s/p left ankle arthroplasty     Action/Plan:   Disocharge planning. Patient will go home with PICC for IV Benefix daily for ten days. Entered in [facility] for [facility].   Anticipated DC Date:  07/28/2011   Anticipated DC Plan:  HOME W HOME HEALTH SERVICES      DC Planning Services  CM consult      [facility] Choice  HOME HEALTH   Choice offered to / List presented to:     DME arranged  Levan Hurst      DME agency  [facility]     [facility] arranged  HH-1 RN      [facility] agency  [facility]   Status of service:  Completed, signed off Medicare Important Message given?  NO (If response is "NO", the following Medicare IM given date fields will be blank) Date Medicare IM given:   Date Additional Medicare IM given:    Discharge Disposition:  HOME W HOME HEALTH SERVICES  Per UR Regulation:  Reviewed for med. necessity/level of care/duration of stay  Comments:  07/28/11 1000--Spoke with Trinna Post, RN taking care of pt. In reference to discharge today.  TC to [place] [facility] [place] with [facility] to inform of discharge today.  Mardella Layman stated [facility] RN to [facility] tomorrow 07/29/11.  In addition, made arrangements for pt. to receive rolling walker and placed order in [facility]. Tera Mater, RN, BSN # [phone]  Utilization review completed. Anette Guarneri, RN, BSN. 07/27/11

## 2011-07-28 NOTE — Discharge Summary (Signed)
Physician Discharge Summary  Patient ID: [name] MRN: [id number] DOB/AGE: 03/17/1966 46 y.o.  Admit date: 07/26/2011 Discharge date: 07/28/2011  Admission Diagnoses: 1.  Left ankel arthritis 2.  hemophilia  Discharge Diagnoses:  1.  Left ankle arthritis s/p total ankle replacement 2.  hemophilia  Discharged Condition: stable  Hospital Course: Pt was admitted to the hospital on 07/26/11 and was taken to the OR that day where he underwent a Left total ankle replacement.  He tolerated the procedure well and was transferred to the inpatient ward where he remained for the duration of his hospital stay.  His post operative course was unremarkable aside for the placement of a PICC line for IV BeneFix for 10 days post op.  He was discharged to home in stable condition on 07/28/11.  Consults: PT  Significant Diagnostic Studies: none  Treatments: IV benefix  Discharge Exam: Blood pressure 135/72, pulse 84, temperature 97.5 F (36.4 C), temperature source Oral, resp. rate 20, SpO2 93.00%. L LE casted.  Knee wound dressed and dry.  PF / DF intact at toes.  Feels LT dorsally and plantarly.  Brisk cap refill in toes.  Disposition: to home in stable condition.  Discharge Orders    Future Orders Please Complete By Expires   Diet - low sodium heart healthy      Call MD / Call 911      Comments:   If you experience chest pain or shortness of breath, CALL 911 and be transported to the hospital emergency room.  If you develope a fever above 101 F, pus (white drainage) or increased drainage or redness at the wound, or calf pain, call your surgeon's office.   Constipation Prevention      Comments:   Drink plenty of fluids.  Prune juice may be helpful.  You may use a stool softener, such as Colace (over the counter) 100 mg twice a day.  Use MiraLax (over the counter) for constipation as needed.   Increase activity slowly as tolerated      Weight Bearing as taught in Physical Therapy      Comments:    Use a walker or crutches as instructed.     Medication List  As of 07/28/2011  9:14 AM   START taking these medications         coagulation factor IX (recomb) 250 UNITS injection   Commonly known as: BENEFIX   Inject 5,250 Units into the vein daily.   Start taking on: 07/29/2011      DSS 100 MG Caps   Take 100 mg by mouth 2 (two) times daily.      HYDROmorphone 2 MG tablet   Commonly known as: DILAUDID   Take 1-3 tablets (2-6 mg total) by mouth every 4 (four) hours as needed for pain.      oxyCODONE 10 MG 12 hr tablet   Commonly known as: OXYCONTIN   2 tabs po q12h x 3 days then 1 tab po q12h x 3 days.      senna 8.6 MG Tabs   Commonly known as: SENOKOT   Take 2 tablets (17.2 mg total) by mouth 2 (two) times daily.         CONTINUE taking these medications         acetaminophen 500 MG tablet   Commonly known as: TYLENOL      naphazoline-pheniramine 0.025-0.3 % ophthalmic solution   Commonly known as: NAPHCON-A      naproxen sodium 220 MG  tablet   Commonly known as: ANAPROX          Where to get your medications    These are the prescriptions that you need to pick up.   You may get these medications from any pharmacy.         coagulation factor IX (recomb) 250 UNITS injection   DSS 100 MG Caps   HYDROmorphone 2 MG tablet   oxyCODONE 10 MG 12 hr tablet   senna 8.6 MG Tabs           Follow up with me in 3 weeks.  Call [phone] for an appointment.  SignedToni Arthurs 07/28/2011, 9:14 AM

## 2011-07-28 NOTE — Progress Notes (Signed)
Physical Therapy Treatment Patient Details Name: [name] MRN: [id number] DOB: 05/07/1966 Today's Date: 07/28/2011  PT Assessment/Plan  PT - Assessment/Plan Comments on Treatment Session: Pt progressing very well with mobility.  Plans are for pt to d/c home today.   PT Frequency: Min 5X/week Follow Up Recommendations: Home health PT Equipment Recommended: Rolling walker with 5" wheels PT Goals  Acute Rehab PT Goals PT Goal: Sit to Supine/Side - Progress: Met PT Goal: Sit to Stand - Progress: Progressing toward goal PT Goal: Stand to Sit - Progress: Progressing toward goal PT Goal: Ambulate - Progress: Progressing toward goal PT Goal: Up/Down Stairs - Progress: Met  PT Treatment Precautions/Restrictions  Restrictions Weight Bearing Restrictions: Yes LLE Weight Bearing: Non weight bearing Mobility (including Balance) Bed Mobility Supine to Sit Details (indicate cue type and reason): Pt sitting on EOB upon arrival back into room after gathering equipment but HOB was elevated.   Sitting - Scoot to Edge of Bed: 6: Modified independent (Device/Increase time) Sit to Supine - Right: 6: Modified independent (Device/Increase time) Transfers Sit to Stand: With upper extremity assist;5: Supervision;From bed Sit to Stand Details (indicate cue type and reason): Cues for hand placement Stand to Sit: 5: Supervision;To bed;Without upper extremity assist Stand to Sit Details: Cues for hand placement Ambulation/Gait Ambulation/Gait Assistance: 5: Supervision Ambulation/Gait Assistance Details (indicate cue type and reason): Pt deferred further distance due to wanting to rest & conserve energy before d/cing home today.   Ambulation Distance (Feet): 20 Feet Assistive device: Rolling walker Gait Pattern: Step-to pattern;Decreased step length - right Stairs: Yes Stairs Assistance: Other (comment) (Min guard (A)) Stairs Assistance Details (indicate cue type and reason): Cues for technique & RW  positioning; Guarding for safety due to decreased balance secondary to NWB LLE Stair Management Technique: No rails;Backwards;With walker Number of Stairs: 1  (3x's)    Exercise    End of Session PT - End of Session Equipment Utilized During Treatment: Gait belt Activity Tolerance: Patient tolerated treatment well Patient left: in bed;with call bell in reach;with family/visitor present General Behavior During Session: [facility] for tasks performed Cognition: [facility] for tasks performed  Lara Mulch 07/28/2011, 10:45 AM [phone]

## 2011-08-27 ENCOUNTER — Encounter (HOSPITAL_COMMUNITY): Payer: Self-pay | Admitting: Respiratory Therapy

## 2011-09-04 ENCOUNTER — Other Ambulatory Visit (HOSPITAL_COMMUNITY): Payer: Self-pay | Admitting: *Deleted

## 2011-09-04 ENCOUNTER — Encounter (HOSPITAL_COMMUNITY): Payer: Self-pay | Admitting: *Deleted

## 2011-09-05 MED ORDER — COAGULATION FACTOR IX (RECOMB) 250 UNITS IV SOLR
5000.0000 [IU] | Freq: Once | INTRAVENOUS | Status: DC
  Filled 2011-09-05: qty 5000

## 2011-09-05 MED ORDER — CEFAZOLIN SODIUM-DEXTROSE 2-3 GM-% IV SOLR
2.0000 g | INTRAVENOUS | Status: AC
  Administered 2011-09-06: 2 g via INTRAVENOUS
  Filled 2011-09-05: qty 50

## 2011-09-06 ENCOUNTER — Encounter (HOSPITAL_COMMUNITY): Payer: Self-pay | Admitting: Anesthesiology

## 2011-09-06 ENCOUNTER — Inpatient Hospital Stay (HOSPITAL_COMMUNITY)
Admission: RE | Admit: 2011-09-06 | Discharge: 2011-09-08 | DRG: 469 | Disposition: A | Discharge status: Home Health | Payer: 59 | Source: Ambulatory Visit | Attending: Orthopedic Surgery | Admitting: Orthopedic Surgery

## 2011-09-06 ENCOUNTER — Ambulatory Visit (HOSPITAL_COMMUNITY): Payer: 59 | Admitting: Anesthesiology

## 2011-09-06 ENCOUNTER — Encounter (HOSPITAL_COMMUNITY)
Admission: RE | Disposition: A | Discharge status: Home Health | Payer: Self-pay | Source: Ambulatory Visit | Attending: Orthopedic Surgery

## 2011-09-06 ENCOUNTER — Encounter (HOSPITAL_COMMUNITY): Payer: Self-pay | Admitting: Surgery

## 2011-09-06 ENCOUNTER — Ambulatory Visit (HOSPITAL_COMMUNITY): Payer: 59

## 2011-09-06 DIAGNOSIS — Z96669 Presence of unspecified artificial ankle joint: Secondary | ICD-10-CM

## 2011-09-06 DIAGNOSIS — M19079 Primary osteoarthritis, unspecified ankle and foot: Principal | ICD-10-CM | POA: Diagnosis present

## 2011-09-06 DIAGNOSIS — D66 Hereditary factor VIII deficiency: Secondary | ICD-10-CM | POA: Diagnosis present

## 2011-09-06 HISTORY — PX: TOTAL ANKLE ARTHROPLASTY: SHX811

## 2011-09-06 LAB — CBC
HCT: 34.2 % — ABNORMAL LOW (ref 39.0–52.0)
Hemoglobin: 11.6 g/dL — ABNORMAL LOW (ref 13.0–17.0)
MCH: 27.5 pg (ref 26.0–34.0)
MCHC: 33.9 g/dL (ref 30.0–36.0)

## 2011-09-06 LAB — SURGICAL PCR SCREEN: Staphylococcus aureus: POSITIVE — AB

## 2011-09-06 SURGERY — ARTHROPLASTY, ANKLE, TOTAL
Anesthesia: General | Site: Ankle | Laterality: Right | Wound class: Clean

## 2011-09-06 MED ORDER — DOCUSATE SODIUM 100 MG PO CAPS
100.0000 mg | ORAL_CAPSULE | Freq: Two times a day (BID) | ORAL | Status: DC
  Administered 2011-09-06: 100 mg via ORAL
  Filled 2011-09-06 (×5): qty 1

## 2011-09-06 MED ORDER — COAGULATION FACTOR IX (RECOMB) 250 UNITS IV SOLR
5130.0000 [IU] | Freq: Two times a day (BID) | INTRAVENOUS | Status: AC
  Administered 2011-09-07 (×3): 5130 [IU] via INTRAVENOUS
  Filled 2011-09-06 (×6): qty 5130

## 2011-09-06 MED ORDER — COAGULATION FACTOR IX (RECOMB) 250 UNITS IV SOLR
4080.0000 [IU] | Freq: Once | INTRAVENOUS | Status: AC
  Administered 2011-09-06: 4080 [IU] via INTRAVENOUS
  Filled 2011-09-06: qty 4080

## 2011-09-06 MED ORDER — ONDANSETRON HCL 4 MG/2ML IJ SOLN: 4.0000 mg | Freq: Four times a day (QID) | INTRAMUSCULAR | Status: DC | PRN

## 2011-09-06 MED ORDER — VANCOMYCIN HCL 500 MG IV SOLR
500.0000 mg | INTRAVENOUS | Status: DC
  Filled 2011-09-06: qty 500

## 2011-09-06 MED ORDER — ACETAMINOPHEN 10 MG/ML IV SOLN
INTRAVENOUS | Status: AC
  Filled 2011-09-06: qty 100

## 2011-09-06 MED ORDER — HYDROMORPHONE HCL PF 1 MG/ML IJ SOLN
INTRAMUSCULAR | Status: AC
  Administered 2011-09-07: 1 mg via INTRAVENOUS
  Filled 2011-09-06: qty 1

## 2011-09-06 MED ORDER — CHLORHEXIDINE GLUCONATE 4 % EX LIQD
60.0000 mL | Freq: Once | CUTANEOUS | Status: DC
  Filled 2011-09-06: qty 60

## 2011-09-06 MED ORDER — HYDROMORPHONE HCL 2 MG PO TABS
2.0000 mg | ORAL_TABLET | ORAL | Status: DC | PRN
  Administered 2011-09-06 – 2011-09-08 (×4): 4 mg via ORAL
  Filled 2011-09-06 (×4): qty 2

## 2011-09-06 MED ORDER — SODIUM CHLORIDE 0.9 % IV SOLN: INTRAVENOUS | Status: DC

## 2011-09-06 MED ORDER — MUPIROCIN 2 % EX OINT
TOPICAL_OINTMENT | Freq: Two times a day (BID) | CUTANEOUS | Status: DC
  Administered 2011-09-06 – 2011-09-07 (×2): 1 via NASAL
  Administered 2011-09-08: 11:00:00 via NASAL

## 2011-09-06 MED ORDER — ONDANSETRON HCL 4 MG/2ML IJ SOLN
4.0000 mg | Freq: Four times a day (QID) | INTRAMUSCULAR | Status: DC | PRN
  Administered 2011-09-07 (×2): 4 mg via INTRAVENOUS
  Filled 2011-09-06 (×2): qty 2

## 2011-09-06 MED ORDER — VANCOMYCIN HCL 500 MG IV SOLR
INTRAVENOUS | Status: DC | PRN
  Administered 2011-09-06: 500 mg

## 2011-09-06 MED ORDER — SCOPOLAMINE 1 MG/3DAYS TD PT72
MEDICATED_PATCH | TRANSDERMAL | Status: DC | PRN
  Administered 2011-09-06: 1.5 mg via TRANSDERMAL

## 2011-09-06 MED ORDER — ONDANSETRON HCL 4 MG PO TABS: 4.0000 mg | ORAL_TABLET | Freq: Four times a day (QID) | ORAL | Status: DC | PRN

## 2011-09-06 MED ORDER — ACETAMINOPHEN 500 MG PO TABS
1000.0000 mg | ORAL_TABLET | Freq: Four times a day (QID) | ORAL | Status: DC | PRN
  Administered 2011-09-08: 1000 mg via ORAL
  Filled 2011-09-06: qty 2

## 2011-09-06 MED ORDER — DIPHENHYDRAMINE HCL 12.5 MG/5ML PO ELIX
12.5000 mg | ORAL_SOLUTION | ORAL | Status: DC | PRN
  Filled 2011-09-06: qty 10

## 2011-09-06 MED ORDER — SODIUM CHLORIDE 0.9 % IV SOLN
INTRAVENOUS | Status: DC
  Administered 2011-09-06 – 2011-09-08 (×4): via INTRAVENOUS

## 2011-09-06 MED ORDER — METHOCARBAMOL 500 MG PO TABS
500.0000 mg | ORAL_TABLET | Freq: Four times a day (QID) | ORAL | Status: DC | PRN
  Administered 2011-09-07 – 2011-09-08 (×2): 500 mg via ORAL
  Filled 2011-09-06 (×2): qty 1

## 2011-09-06 MED ORDER — ONDANSETRON HCL 4 MG/2ML IJ SOLN
INTRAMUSCULAR | Status: DC | PRN
  Administered 2011-09-06 (×2): 4 mg via INTRAVENOUS

## 2011-09-06 MED ORDER — NAPHAZOLINE-PHENIRAMINE 0.025-0.3 % OP SOLN
1.0000 [drp] | OPHTHALMIC | Status: DC | PRN
  Filled 2011-09-06: qty 15

## 2011-09-06 MED ORDER — CEFAZOLIN SODIUM 1-5 GM-% IV SOLN
1.0000 g | Freq: Three times a day (TID) | INTRAVENOUS | Status: AC
  Administered 2011-09-06 – 2011-09-07 (×3): 1 g via INTRAVENOUS
  Filled 2011-09-06 (×3): qty 50

## 2011-09-06 MED ORDER — SUFENTANIL CITRATE 50 MCG/ML IV SOLN
INTRAVENOUS | Status: DC | PRN
  Administered 2011-09-06: 5 ug via INTRAVENOUS
  Administered 2011-09-06: 10 ug via INTRAVENOUS
  Administered 2011-09-06: 25 ug via INTRAVENOUS
  Administered 2011-09-06: 10 ug via INTRAVENOUS

## 2011-09-06 MED ORDER — MIDAZOLAM HCL 5 MG/5ML IJ SOLN
INTRAMUSCULAR | Status: DC | PRN
  Administered 2011-09-06 (×2): 1 mg via INTRAVENOUS

## 2011-09-06 MED ORDER — PROPOFOL 10 MG/ML IV EMUL
INTRAVENOUS | Status: DC | PRN
  Administered 2011-09-06: 110 mg via INTRAVENOUS
  Administered 2011-09-06: 40 mg via INTRAVENOUS
  Administered 2011-09-06: 20 mg via INTRAVENOUS
  Administered 2011-09-06: 30 mg via INTRAVENOUS
  Administered 2011-09-06: 50 mg via INTRAVENOUS

## 2011-09-06 MED ORDER — DROPERIDOL 2.5 MG/ML IJ SOLN
INTRAMUSCULAR | Status: DC | PRN
  Administered 2011-09-06: 0.625 mg via INTRAVENOUS

## 2011-09-06 MED ORDER — BACITRACIN ZINC 500 UNIT/GM EX OINT
TOPICAL_OINTMENT | CUTANEOUS | Status: DC | PRN
  Administered 2011-09-06: 1 via TOPICAL

## 2011-09-06 MED ORDER — ZOLPIDEM TARTRATE 5 MG PO TABS: 5.0000 mg | ORAL_TABLET | Freq: Every evening | ORAL | Status: DC | PRN

## 2011-09-06 MED ORDER — MUPIROCIN 2 % EX OINT
TOPICAL_OINTMENT | CUTANEOUS | Status: AC
  Filled 2011-09-06: qty 22

## 2011-09-06 MED ORDER — GLYCOPYRROLATE 0.2 MG/ML IJ SOLN
INTRAMUSCULAR | Status: DC | PRN
  Administered 2011-09-06: .6 mg via INTRAVENOUS

## 2011-09-06 MED ORDER — HYDROMORPHONE HCL PF 1 MG/ML IJ SOLN
0.2500 mg | INTRAMUSCULAR | Status: DC | PRN
  Administered 2011-09-06 (×3): 0.5 mg via INTRAVENOUS

## 2011-09-06 MED ORDER — LIDOCAINE HCL (CARDIAC) 20 MG/ML IV SOLN
INTRAVENOUS | Status: DC | PRN
  Administered 2011-09-06: 100 mg via INTRAVENOUS

## 2011-09-06 MED ORDER — COAGULATION FACTOR IX (RECOMB) 250 UNITS IV SOLR
2040.0000 [IU] | Freq: Once | INTRAVENOUS | Status: AC
  Administered 2011-09-06: 2040 [IU] via INTRAVENOUS
  Filled 2011-09-06: qty 2040

## 2011-09-06 MED ORDER — METHOCARBAMOL 100 MG/ML IJ SOLN
500.0000 mg | Freq: Four times a day (QID) | INTRAVENOUS | Status: DC | PRN
  Filled 2011-09-06: qty 5

## 2011-09-06 MED ORDER — 0.9 % SODIUM CHLORIDE (POUR BTL) OPTIME
TOPICAL | Status: DC | PRN
  Administered 2011-09-06: 1000 mL

## 2011-09-06 MED ORDER — DEXAMETHASONE SODIUM PHOSPHATE 4 MG/ML IJ SOLN
INTRAMUSCULAR | Status: DC | PRN
  Administered 2011-09-06: 4 mg via INTRAVENOUS

## 2011-09-06 MED ORDER — ROCURONIUM BROMIDE 100 MG/10ML IV SOLN
INTRAVENOUS | Status: DC | PRN
  Administered 2011-09-06: 50 mg via INTRAVENOUS

## 2011-09-06 MED ORDER — SENNA 8.6 MG PO TABS
1.0000 | ORAL_TABLET | Freq: Two times a day (BID) | ORAL | Status: DC
  Filled 2011-09-06 (×5): qty 1

## 2011-09-06 MED ORDER — HYDROMORPHONE HCL PF 1 MG/ML IJ SOLN
1.0000 mg | INTRAMUSCULAR | Status: DC | PRN
  Administered 2011-09-06 – 2011-09-08 (×16): 1 mg via INTRAVENOUS
  Filled 2011-09-06 (×16): qty 1

## 2011-09-06 MED ORDER — LACTATED RINGERS IV SOLN
INTRAVENOUS | Status: DC | PRN
  Administered 2011-09-06 (×2): via INTRAVENOUS

## 2011-09-06 MED ORDER — ACETAMINOPHEN 10 MG/ML IV SOLN
INTRAVENOUS | Status: DC | PRN
  Administered 2011-09-06: 1000 mg via INTRAVENOUS

## 2011-09-06 MED ORDER — NEOSTIGMINE METHYLSULFATE 1 MG/ML IJ SOLN
INTRAMUSCULAR | Status: DC | PRN
  Administered 2011-09-06: 3 mg via INTRAVENOUS

## 2011-09-06 SURGICAL SUPPLY — 67 items
BANDAGE ESMARK 6X9 LF (GAUZE/BANDAGES/DRESSINGS) ×1 IMPLANT
BLADE LONG MED 31X9 (MISCELLANEOUS) ×2 IMPLANT
BLADE RECIPRO TAPERED (BLADE) ×2 IMPLANT
BLADE SAGITTAL (BLADE) ×2
BLADE SAW THK.89X75X18XSGTL (BLADE) ×1 IMPLANT
BLADE SURG 15 STRL LF DISP TIS (BLADE) ×2 IMPLANT
BLADE SURG 15 STRL SS (BLADE) ×4
BNDG CMPR 9X6 STRL LF SNTH (GAUZE/BANDAGES/DRESSINGS) ×1
BNDG ESMARK 6X9 LF (GAUZE/BANDAGES/DRESSINGS) ×2
CHLORAPREP W/TINT 26ML (MISCELLANEOUS) ×2 IMPLANT
CLOTH BEACON ORANGE TIMEOUT ST (SAFETY) ×2 IMPLANT
CORE SLIDING STAR SZ 8MM (Orthopedic Implant) ×1 IMPLANT
COVER SURGICAL LIGHT HANDLE (MISCELLANEOUS) ×2 IMPLANT
CUFF TOURNIQUET SINGLE 34IN LL (TOURNIQUET CUFF) ×2 IMPLANT
CUFF TOURNIQUET SINGLE 44IN (TOURNIQUET CUFF) IMPLANT
DRAPE C-ARM 42X72 X-RAY (DRAPES) ×2 IMPLANT
DRAPE C-ARMOR (DRAPES) ×2 IMPLANT
DRAPE EXTREMITY T 121X128X90 (DRAPE) ×2 IMPLANT
DRAPE ORTHO SPLIT 77X108 STRL (DRAPES) ×2
DRAPE SURG ORHT 6 SPLT 77X108 (DRAPES) ×1 IMPLANT
DRAPE U-SHAPE 47X51 STRL (DRAPES) ×2 IMPLANT
DRSG ADAPTIC 3X8 NADH LF (GAUZE/BANDAGES/DRESSINGS) ×1 IMPLANT
DRSG MEPILEX BORDER 4X4 (GAUZE/BANDAGES/DRESSINGS) ×1 IMPLANT
DRSG PAD ABDOMINAL 8X10 ST (GAUZE/BANDAGES/DRESSINGS) ×3 IMPLANT
ELECT REM PT RETURN 9FT ADLT (ELECTROSURGICAL) ×2
ELECTRODE REM PT RTRN 9FT ADLT (ELECTROSURGICAL) ×1 IMPLANT
EVACUATOR 1/8 PVC DRAIN (DRAIN) ×1 IMPLANT
GAUZE SPONGE 4X4 12PLY STRL LF (GAUZE/BANDAGES/DRESSINGS) ×1 IMPLANT
GEL ULTRASOUND 20GR AQUASONIC (MISCELLANEOUS) ×3 IMPLANT
GLOVE BIO SURGEON STRL SZ8 (GLOVE) ×4 IMPLANT
GLOVE BIOGEL PI IND STRL 7.0 (GLOVE) IMPLANT
GLOVE BIOGEL PI IND STRL 8 (GLOVE) ×1 IMPLANT
GLOVE BIOGEL PI INDICATOR 7.0 (GLOVE) ×1
GLOVE BIOGEL PI INDICATOR 8 (GLOVE)
GLOVE ORTHO TXT STRL SZ7.5 (GLOVE) ×1 IMPLANT
GLOVE SURG SS PI 6.5 STRL IVOR (GLOVE) ×1 IMPLANT
GLOVE SURG SS PI 7.0 STRL IVOR (GLOVE) ×1 IMPLANT
GOWN PREVENTION PLUS XLARGE (GOWN DISPOSABLE) ×3 IMPLANT
GOWN STRL NON-REIN LRG LVL3 (GOWN DISPOSABLE) ×5 IMPLANT
GOWN STRL REIN XL XLG (GOWN DISPOSABLE) ×1 IMPLANT
IMPLANT TALAR STAR SZ S RT (Orthopedic Implant) ×1 IMPLANT
IMPLANT TIBIAL STAR SZ L (Orthopedic Implant) ×1 IMPLANT
KIT BASIN OR (CUSTOM PROCEDURE TRAY) ×2 IMPLANT
KIT ROOM TURNOVER OR (KITS) ×2 IMPLANT
MANIFOLD NEPTUNE II (INSTRUMENTS) ×2 IMPLANT
NS IRRIG 1000ML POUR BTL (IV SOLUTION) ×2 IMPLANT
PACK TOTAL JOINT (CUSTOM PROCEDURE TRAY) ×2 IMPLANT
PAD ARMBOARD 7.5X6 YLW CONV (MISCELLANEOUS) ×4 IMPLANT
PAD CAST 4YDX4 CTTN HI CHSV (CAST SUPPLIES) ×3 IMPLANT
PADDING CAST ABS 6INX4YD NS (CAST SUPPLIES) ×1
PADDING CAST ABS COTTON 6X4 NS (CAST SUPPLIES) IMPLANT
PADDING CAST COTTON 4X4 STRL (CAST SUPPLIES)
PADDING WEBRIL 4 STERILE (GAUZE/BANDAGES/DRESSINGS) ×1 IMPLANT
SPLINT FIBERGLASS 4X15 (CAST SUPPLIES) ×1 IMPLANT
SPONGE GAUZE 4X4 12PLY (GAUZE/BANDAGES/DRESSINGS) ×2 IMPLANT
SUCTION FRAZIER TIP 10 FR DISP (SUCTIONS) ×2 IMPLANT
SUT MNCRL 3 0 RB1 (SUTURE) ×2 IMPLANT
SUT MONOCRYL 3 0 RB1 (SUTURE) ×2
SUT PROLENE 3 0 PS 2 (SUTURE) ×2 IMPLANT
SUT VIC AB 0 CT1 27 (SUTURE) ×2
SUT VIC AB 0 CT1 27XBRD ANBCTR (SUTURE) ×1 IMPLANT
SUT VIC AB 2-0 CT1 27 (SUTURE) ×2
SUT VIC AB 2-0 CT1 TAPERPNT 27 (SUTURE) ×2 IMPLANT
SUT VICRYL 4-0 PS2 18IN ABS (SUTURE) ×2 IMPLANT
TOWEL OR 17X24 6PK STRL BLUE (TOWEL DISPOSABLE) ×2 IMPLANT
TOWEL OR 17X26 10 PK STRL BLUE (TOWEL DISPOSABLE) ×2 IMPLANT
WATER STERILE IRR 1000ML POUR (IV SOLUTION) ×2 IMPLANT

## 2011-09-06 NOTE — Transfer of Care (Signed)
Immediate Anesthesia Transfer of Care Note  Patient: [name]  Procedure(s) Performed: Procedure(s) (LRB): TOTAL ANKLE ARTHOPLASTY (Right)  Patient Location: PACU  Anesthesia Type: General  Level of Consciousness: sedated  Airway & Oxygen Therapy: Patient Spontanous Breathing and Patient connected to nasal cannula oxygen  Post-op Assessment: Report given to PACU RN and Post -op Vital signs reviewed and stable  Post vital signs: stable  Complications: No apparent anesthesia complications

## 2011-09-06 NOTE — Anesthesia Preprocedure Evaluation (Signed)
Anesthesia Evaluation  Patient identified by MRN, date of birth, ID band Patient awake    Reviewed: Allergy & Precautions, H&P , NPO status , Patient's Chart, lab work & pertinent test results  History of Anesthesia Complications (+) PONV  Airway       Dental   Pulmonary          Cardiovascular     Neuro/Psych    GI/Hepatic   Endo/Other    Renal/GU      Musculoskeletal   Abdominal   Peds  Hematology  (+) Blood dyscrasia, ,   Anesthesia Other Findings   Reproductive/Obstetrics                           Anesthesia Physical Anesthesia Plan  ASA: II  Anesthesia Plan: General   Post-op Pain Management:    Induction: Intravenous  Airway Management Planned: LMA  Additional Equipment:   Intra-op Plan:   Post-operative Plan: Extubation in OR  Informed Consent: I have reviewed the patients History and Physical, chart, labs and discussed the procedure including the risks, benefits and alternatives for the proposed anesthesia with the patient or authorized representative who has indicated his/her understanding and acceptance.   Dental advisory given  Plan Discussed with: CRNA, Anesthesiologist and Surgeon  Anesthesia Plan Comments:         Anesthesia Quick Evaluation

## 2011-09-06 NOTE — Progress Notes (Signed)
Notified Anesthesiologist, Dr. Michelle Piper, that pt is here.  Healthy pt.  Had bmet in 06/2011.  Dr. Michelle Piper stated no need to repeat bmet.//L. Dixie Coppa,RN

## 2011-09-06 NOTE — Preoperative (Signed)
Beta Blockers   Reason not to administer Beta Blockers:Not Applicable 

## 2011-09-06 NOTE — H&P (Signed)
[name] is an 46 y.o. male.   Chief Complaint: right ankle pain HPI: 46 y/o male with PMH of hemophilia presents now for right total ankle repalcement.  He is 6 weeks s/p L ankle replacement and has been doing well.  He c/o severe pain in the right ankle that has not been adequeatly treated with oral pain meds, bracing and steroid injections.  Past Medical History  Diagnosis Date  . Blood dyscrasia     hemophiliac  . Arthritis   . PONV (postoperative nausea and vomiting)     no probable 1/13    Past Surgical History  Procedure Date  . Ankle arthroscopy     lft ,rt x2  . Total ankle arthroplasty 07/26/2011    Procedure: TOTAL ANKLE ARTHOPLASTY;  Surgeon: Toni Arthurs, MD;  Location: [facility] OR;  Service: Orthopedics;  Laterality: Left;  Left Total Ankle Replacement     Family History  Problem Relation Age of Onset  . Anesthesia problems Neg Hx    Social History:  reports that he has never smoked. He does not have any smokeless tobacco history on file. He reports that he does not drink alcohol or use illicit drugs.  Allergies: No Known Allergies  Medications Prior to Admission  Medication Dose Route Frequency Provider Last Rate Last Dose  . ceFAZolin (ANCEF) IVPB 2 g/50 mL premix  2 g Intravenous 60 min Pre-Op Toni Arthurs, MD      . coagulation factor IX (recomb) (BENEFIX) injection 5,000 Units  5,000 Units Intravenous Once Toni Arthurs, MD       Medications Prior to Admission  Medication Sig Dispense Refill  . acetaminophen (TYLENOL) 500 MG tablet Take 1,000 mg by mouth as needed. For pain      . naphazoline-pheniramine (NAPHCON-A) 0.025-0.3 % ophthalmic solution Place 1 drop into both eyes as needed. For dry eyes.       . naproxen sodium (ANAPROX) 220 MG tablet Take 220 mg by mouth as needed. For pain         No results found for this or any previous visit (from the past 48 hour(s)). No results found.  ROS  No recent f/c/n/v/wt loss  Height 5\' 8"  (1.727 m), weight 77.111  kg (170 lb). Physical Exam WN WD male in nad.  A and O x 4.  Mood ana ffect normal.  EOMI.  Repsirations unlabored.  R ankel with healthy skin.  No gross deformtiy.  NVI.  Decreased ROM.    xrays show end stage arthrtiis of the ankle.  Assessment/Plan Right ankle arthritis.  To OR for right total ankle replacement.  The risks and benefits of the alternative treatment options have been discussed in detail.  The patient wishes to proceed with surgery and specifically understands risks of bleeding, infection, nerve damage, blood clots, need for additional surgery, amputation and death.   Toni Arthurs 10-01-2011, 9:17 AM

## 2011-09-06 NOTE — Anesthesia Procedure Notes (Signed)
Procedure Name: Intubation Date/Time: 09/06/2011 2:06 PM Performed by: Darcey Nora Pre-anesthesia Checklist: Patient identified, Emergency Drugs available, Suction available and Patient being monitored Patient Re-evaluated:Patient Re-evaluated prior to inductionOxygen Delivery Method: Circle System Utilized Preoxygenation: Pre-oxygenation with 100% oxygen Intubation Type: IV induction Ventilation: Mask ventilation without difficulty Tube size: 7.5 mm Number of attempts: 1 Airway Equipment and Method: stylet Placement Confirmation: ETT inserted through vocal cords under direct vision,  positive ETCO2 and breath sounds checked- equal and bilateral Secured at: 21 cm Dental Injury: Teeth and Oropharynx as per pre-operative assessment

## 2011-09-06 NOTE — Anesthesia Postprocedure Evaluation (Signed)
Anesthesia Post Note  Patient: [name]  Procedure(s) Performed: Procedure(s) (LRB): TOTAL ANKLE ARTHOPLASTY (Right)  Anesthesia type: General  Patient location: PACU  Post pain: Pain level controlled and Adequate analgesia  Post assessment: Post-op Vital signs reviewed, Patient's Cardiovascular Status Stable, Respiratory Function Stable, Patent Airway and Pain level controlled  Last Vitals:  Filed Vitals:   09/06/11 1700  BP:   Pulse: 73  Temp:   Resp: 13    Post vital signs: Reviewed and stable  Level of consciousness: awake, alert  and oriented  Complications: No apparent anesthesia complications

## 2011-09-06 NOTE — Brief Op Note (Signed)
09/06/2011  4:52 PM  PATIENT:  [name]  46 y.o. male  PRE-OPERATIVE DIAGNOSIS:  right ankle arthritis   POST-OPERATIVE DIAGNOSIS:  right ankle arthritis   Procedure(s): TOTAL ANKLE ARTHOPLASTY Fluoro > 1 hour  SURGEON:  Toni Arthurs, MD  ASSISTANT: n/a  ANESTHESIA:   General  EBL:  minimal   TOURNIQUET:   Total Tourniquet Time Documented: Thigh (Right) - 130 minutes  COMPLICATIONS:  None apparent  DISPOSITION:  Extubated, awake and stable to recovery.  DICTATION ID:   [id number]

## 2011-09-07 MED ORDER — GELATIN ABSORBABLE 100 EX MISC
1.0000 | Freq: Once | CUTANEOUS | Status: DC
  Filled 2011-09-07: qty 1

## 2011-09-07 MED ORDER — SODIUM CHLORIDE 0.9 % IJ SOLN
10.0000 mL | INTRAMUSCULAR | Status: DC | PRN
Start: 2011-09-07 — End: 2011-09-08

## 2011-09-07 MED ORDER — PROMETHAZINE HCL 25 MG/ML IJ SOLN
25.0000 mg | Freq: Four times a day (QID) | INTRAMUSCULAR | Status: DC | PRN
  Administered 2011-09-07: 25 mg via INTRAVENOUS
  Filled 2011-09-07: qty 1

## 2011-09-07 NOTE — Progress Notes (Signed)
Subjective: 1 Day Post-Op Procedure(s) (LRB): TOTAL ANKLE ARTHOPLASTY (Right) Patient reports pain as well controlled.  Dilaudid working well.  + n/v.  Objective: Vital signs in last 24 hours: Temp:  [97.8 F (36.6 C)-99.1 F (37.3 C)] 97.8 F (36.6 C) (02/15 0557) Pulse Rate:  [70-92] 70  (02/15 0557) Resp:  [12-24] 18  (02/15 0557) BP: (118-154)/(73-86) 119/73 mmHg (02/15 0557) SpO2:  [92 %-100 %] 97 % (02/15 0557) Weight:  [77.111 kg (170 lb)] 77.111 kg (170 lb) (02/14 1835)  Intake/Output from previous day: 02/14 0701 - 02/15 0700 In: 2448.3 [I.V.:2448.3] Out: 825 [Urine:575; Drains:200; Blood:50] Intake/Output this shift: Total I/O In: 240 [P.O.:240] Out: -    Basename 09/06/11 1104  HGB 11.6*    Basename 09/06/11 1104  WBC 6.2  RBC 4.22  HCT 34.2*  PLT 140*   No results found for this basename: NA:2,K:2,CL:2,CO2:2,BUN:2,CREATININE:2,GLUCOSE:2,CALCIUM:2 in the last 72 hours No results found for this basename: LABPT:2,INR:2 in the last 72 hours  PE:  wn wd male in nad.  R LE casted.  Wiggles toes.  Feels lt.  Brisk cap refill.  Drain with serosang output.  L ankle dressed and dry.  Assessment/Plan: 1 Day Post-Op Procedure(s) (LRB): TOTAL ANKLE ARTHOPLASTY (Right) Plan for discharge tomorrow  PICC line today.  Drain removed.  Toni Arthurs 09/07/2011, 1:56 PM

## 2011-09-07 NOTE — Progress Notes (Signed)
CARE MANAGEMENT NOTE 09/07/2011  Action/Plan:   Discharge planning. Spoke to patient and wife. Patient states he has used [facility] in the past and will do so now. requesting Nurse Zella Ball and requesting that they be there on time. Medication time sensitive. Has PICC.   Anticipated DC Date:  09/08/2011   Anticipated DC Plan:  HOME W HOME HEALTH SERVICES      DC Planning Services  CM consult      [facility] Choice  HOME HEALTH   Choice offered to / List presented to:  C-1 Patient        HH arranged  HH-1 RN      [facility] agency  [facility]   Status of service:  Completed, signed off Medicare Important Message given?   (If response is "NO", the following Medicare IM given date fields will be blank) Date Medicare IM given:   Date Additional Medicare IM given:    Discharge Disposition:  Home with Home Health  Per UR Regulation:    Comments:  09/07/11 1635 Vance Peper, RN BSN Patient has crutches. States he has the medication already, just needs the nurse and the flushes. Relayed this to [facility] the [facility] liasion, as this was a major concern for Mr. Quinney, regarding the timeliness and being prepared to start infusion.

## 2011-09-07 NOTE — Progress Notes (Signed)
Utilization review completed. Beatryce Colombo, RN, BSN. 09/07/11  

## 2011-09-07 NOTE — Op Note (Signed)
[name], [name]               ACCOUNT NO.:  [id number]  MEDICAL RECORD NO.:  [id number]  LOCATION:  5003                         FACILITY:  [facility]  PHYSICIAN:  Toni Arthurs, MD        DATE OF BIRTH:  Jan 07, 1966  DATE OF PROCEDURE:  09/06/2011 DATE OF DISCHARGE:                              OPERATIVE REPORT   PREOPERATIVE DIAGNOSIS:  Right ankle arthritis.  POSTOPERATIVE DIAGNOSIS:  Right ankle arthritis.  PROCEDURE: 1. Right total ankle arthroplasty. 2. Intraoperative interpretation of fluoroscopic imaging greater than     1 hour.  SURGEON:  Toni Arthurs, MD  ANESTHESIA:  General.  ESTIMATED BLOOD LOSS:  Minimal.  TOURNIQUET TIME:  Two hours and 10 minutes at 250 mmHg.  COMPLICATIONS:  None apparent.  DISPOSITION:  Extubated, awake and stable to recovery.  INDICATIONS FOR PROCEDURE:  The patient is a 46 year old male who has a history of hemophilia.  He has developed bilateral ankle arthritis.  He is now 6 weeks status post left total ankle replacement and presents now for right total ankle replacement to treat his end-stage arthritis.  He presents now for this procedure as a separate diagnosis in the postoperative period from his left total ankle replacement.  He understands the risks, benefits, and the alternative treatment options. He specifically understands risks of bleeding, infection, nerve damage, blood clots, need for additional surgery, amputation, and death.  PROCEDURE IN DETAIL:  After preoperative consent was obtained, the correct operative site was identified.  The patient was brought to the operating room, placed supine on the operating table.  General anesthesia was induced.  Preoperative antibiotics were administered. Surgical time-out was taken.  The right lower extremity was prepped and draped in standard sterile fashion with a tourniquet around the thigh. The extremity was exsanguinated and tourniquet was inflated to 250 mmHg. An anterior  incision was made over the EHL tendon sheath.  The skin and subcutaneous tissue were dissected with a knife.  The extensor retinaculum was then incised over the EHL tendon and released proximally and distally.  Care was taken to protect the crossing branches of superficial peroneal nerve.  The neurovascular bundle was identified and the interval between the EHL and tibialis anterior tendons.  It was mobilized and retracted laterally.  It was protected throughout the course of the operation.  The anterior ankle joint capsule was incised. It was released sharply, medially and laterally exposing the anterior joint.  The osteophytes were resected allowing exposure of the tibial plafond.  A 0.25-inch osteotome was inserted into the medial gutter.  A 3.2-mm guidepin was then inserted at the level of the tibial tubercle through a stab incision in line with the osteotome.  The external alignment guide was then applied and rotation was set with a T-handle on the distal cutting block.  The AP view on fluoroscopy confirmed appropriate alignment of the cutting guide along with the tibial crest. A lateral view confirmed appropriate alignment in the lateral plane using the outrigger.  The guide was then pinned to the distal tibia. The distal cutting block was applied and an Angel wing was inserted.  A lateral view was then obtained at the level of  the ankle to establish appropriate tibial resection.  The AP view was then obtained and used to confirm appropriate medial and lateral position of the cutting block. Distal cutting block was then pinned into position.  The sagittal cut was made followed by the reciprocal cut at the medial gutter.  The block was removed.  The distal bone fragments were removed with an osteotome, a curette and rongeur.  The talar cutting guide was then applied.  The lateral view was obtained showing appropriate position of the cutting guide relative to the talus.  The guide was  pinned and the dome of the talus was cut.  Bone fragments were removed.  The sizing guide was then inserted and the joint was noted to be quite tight.  The tibial cut was then remade approximately 2 mm more proximal.  The talar cut was remade approximately 1 mm more distal.  The sizing guide was then reinserted and noted to have appropriate space.  The talar cut was then incised as a small.  A size small guide was pinned into position.  A size small datum was then inserted.  AP and lateral views confirmed appropriate position of the datum.  It was then securely fastened with 2 shoulder pins.  The anterior-posterior cutting guide was inserted, and anterior- posterior cuts were made.  The medial and lateral cutting guide was then inserted, and the medial and lateral cuts were made.  The datum was removed.  The bone fragments were all removed from the cuts.  The size small window trial was inserted and was noted to fit appropriately.  It was pinned into position.  The keel hole was drilled and punched.  The wound was irrigated copiously.  A small amount of vancomycin powder was placed on the talar cut, and a size small talus was impacted into position.  The appropriate position was confirmed on lateral x-rays. The distal tibia was measured at 40 mm.  A size large trial tibia was inserted followed by a 6-mm poly.  The lateral view confirmed appropriate size of the trial tibial component.  The trial poly and tibial component were removed after drilling the 2 barrel holes.  A final size large tibial component was inserted and impacted into position.  A lateral view was obtained showing appropriate position of the tibial component.  The ankle was noted to be quite tight and not reach neutral dorsiflexion with the size poly.  The 9-mm poly was removed and a final 8-mm polyethylene spacer was inserted.  The ankle was noted to dorsiflex to neutral without difficulty.  Wound was again irrigated  copiously.  Medial and lateral gutters were cleared of impinging bone.  A medium Hemovac drain was inserted in the depth of the wound.  The barrel holes were bone grafted.  The anterior ankle joint capsule was closed with simple sutures of 0 Vicryl.  The extensor retinaculum was closed with simple and running sutures of 0 Vicryl. Subcutaneous tissue was closed with inverted simple sutures of 3-0 Monocryl.  The skin incision was closed with 3-0 Prolene.  Sterile dressings were applied followed by a well-padded short-leg cast.  The patient was then awakened from anesthesia and transported to the recovery room in stable condition.  The tourniquet had been released at 2 hours and 10 minutes after application of the dressings.  FOLLOWUP PLAN:  The patient will be admitted to the hospital for 5000 units of BeneFIX every 12 hours x3 doses.  He will then be discharged to  home with a PICC line on 5000 units daily for the next 10 days.     Toni Arthurs, MD     JH/MEDQ  D:  09/06/2011  T:  09/07/2011  Job:  [id number]

## 2011-09-07 NOTE — Plan of Care (Signed)
Problem: Phase I Progression Outcomes Goal: Tubes/drains patent Outcome: Completed/Met Date Met:  09/07/11 Hemovac d/c'd by MD

## 2011-09-08 MED ORDER — OXYCODONE HCL 20 MG PO TB12
20.0000 mg | ORAL_TABLET | Freq: Once | ORAL | Status: AC
  Administered 2011-09-08: 20 mg via ORAL
  Filled 2011-09-08: qty 1

## 2011-09-08 MED ORDER — OXYCODONE HCL 10 MG PO TB12: ORAL_TABLET | ORAL | Status: AC

## 2011-09-08 MED ORDER — HYDROMORPHONE HCL 2 MG PO TABS: 2.0000 mg | ORAL_TABLET | ORAL | Status: AC | PRN

## 2011-09-08 MED ORDER — COAGULATION FACTOR IX (RECOMB) 250 UNITS IV SOLR
5130.0000 [IU] | Freq: Once | INTRAVENOUS | Status: AC
  Administered 2011-09-08: 5130 [IU] via INTRAVENOUS
  Filled 2011-09-08: qty 5130

## 2011-09-08 MED ORDER — PROMETHAZINE HCL 12.5 MG PO TABS: 12.5000 mg | ORAL_TABLET | Freq: Four times a day (QID) | ORAL | Status: AC | PRN

## 2011-09-08 NOTE — Discharge Instructions (Signed)
Home Health RN to be provided thru [facility] [phone]  Toni Arthurs, MD [facility]  Please read the following information regarding your care after surgery.  Medications  You only need a prescription for the narcotic pain medicine (ex. oxycodone, Percocet, Norco).  All of the other medicines listed below are available over the counter. X acetominophen (Tylenol) 650 mg every 4-6 hours as you need for minor pain X oxycontin as prescribed for pain X dilaudid as prescribed for pain X phenergan as prescribed for nausea   Narcotic pain medicine (ex. oxycodone, Percocet, Vicodin) will cause constipation.  To prevent this problem, take the following medicines while you are taking any pain medicine. X docusate sodium (Colace) 100 mg twice a day X senna (Senokot) 2 tablets twice a day  ? To help prevent blood clots, take an aspirin (325 mg) once a day for a month after surgery.  You should also get up every hour while you are awake to move around.    Weight Bearing ? Bear weight when you are able on your operated leg or foot. ? Bear weight only on the heel of your operated foot in the post-op shoe. X Do not bear any weight on the right leg or foot.  Bear weight on the right leg only in the cam walker boot.  Cast / Splint / Dressing X Keep your splint or cast clean and dry.  Don't put anything (coat hanger, pencil, etc) down inside of it.  If it gets damp, use a hair dryer on the cool setting to dry it.  If it gets soaked, call the office to schedule an appointment for a cast change. ? Remove your dressing 3 days after surgery and cover the incisions with dry dressings.    After your dressing, cast or splint is removed; you may shower, but do not soak or scrub the wound.  Allow the water to run over it, and then gently pat it dry.  Swelling It is normal for you to have swelling where you had surgery.  To reduce swelling and pain, keep your toes above your nose for at least 3  days after surgery.  It may be necessary to keep your foot or leg elevated for several weeks.  If it hurts, it should be elevated.  Follow Up Call my office at [phone] when you are discharged from the hospital or surgery center to schedule an appointment to be seen three weeks after surgery.  Call my office at [phone] if you develop a fever >101.5 F, nausea, vomiting, bleeding from the surgical site or severe pain.

## 2011-09-08 NOTE — Discharge Summary (Signed)
Physician Discharge Summary  Patient ID: [name] MRN: [id number] DOB/AGE: [birth year] 46 y.o.  Admit date: 09/06/2011 Discharge date: 09/08/2011  Admission Diagnoses:  Hemophilia, right ankle arthritis, left ankle arthritis s/p TAR  Discharge Diagnoses:  Hemophilia, right ankle arthritis s/p TAR, left ankle arthritis s/p TAR  Discharged Condition: stable  Hospital Course: Pt was admitted on 2/14 and taken to the OR where he underwent R TAR.  He tolerated the procedure well and was continued on Benefix 5000 IU q12 x 3 doses post op.  He was discharged to home in stable condition on 2/16.  Consults: None  Significant Diagnostic Studies: none  Treatments: surgery: right total ankle replacement  Discharge Exam: Blood pressure 151/86, pulse 104, temperature 101.1 F (38.4 C), temperature source Oral, resp. rate 18, height 5\' 8"  (1.727 m), weight 77.111 kg (170 lb), SpO2 98.00%. R LE in cast.  Knee wound dressed and dry.  NVI distally.  L ankle incision healing well.  Disposition: Home-Health Care Svc  Discharge Orders    Future Orders Please Complete By Expires   Diet - low sodium heart healthy      Call MD / Call 911      Comments:   If you experience chest pain or shortness of breath, CALL 911 and be transported to the hospital emergency room.  If you develope a fever above 101 F, pus (white drainage) or increased drainage or redness at the wound, or calf pain, call your surgeon's office.   Constipation Prevention      Comments:   Drink plenty of fluids.  Prune juice may be helpful.  You may use a stool softener, such as Colace (over the counter) 100 mg twice a day.  Use MiraLax (over the counter) for constipation as needed.   Increase activity slowly as tolerated        Medication List  As of 09/08/2011  9:15 AM   STOP taking these medications         naproxen sodium 220 MG tablet         TAKE these medications         acetaminophen 500 MG tablet   Commonly known  as: TYLENOL   Take 1,000 mg by mouth as needed. For pain      HYDROmorphone 2 MG tablet   Commonly known as: DILAUDID   Take 1-2 tablets (2-4 mg total) by mouth every 4 (four) hours as needed for pain.      naphazoline-pheniramine 0.025-0.3 % ophthalmic solution   Commonly known as: NAPHCON-A   Place 1 drop into both eyes as needed. For dry eyes.      oxyCODONE 10 MG 12 hr tablet   Commonly known as: OXYCONTIN   2 tabs po q12h x 3 days then 1 tab po q12h x 3 days.      promethazine 12.5 MG tablet   Commonly known as: PHENERGAN   Take 1 tablet (12.5 mg total) by mouth every 6 (six) hours as needed for nausea.           F/u with me in 3 weeks.  SignedToni Arthurs 09/08/2011, 9:15 AM

## 2011-09-10 ENCOUNTER — Encounter (HOSPITAL_COMMUNITY): Payer: Self-pay | Admitting: Orthopedic Surgery

## 2011-10-30 ENCOUNTER — Ambulatory Visit: Payer: 59 | Attending: Orthopedic Surgery | Admitting: Rehabilitation

## 2011-10-30 DIAGNOSIS — R262 Difficulty in walking, not elsewhere classified: Secondary | ICD-10-CM | POA: Insufficient documentation

## 2011-10-30 DIAGNOSIS — IMO0001 Reserved for inherently not codable concepts without codable children: Secondary | ICD-10-CM | POA: Insufficient documentation

## 2011-10-30 DIAGNOSIS — M25673 Stiffness of unspecified ankle, not elsewhere classified: Secondary | ICD-10-CM | POA: Insufficient documentation

## 2011-10-30 DIAGNOSIS — M25676 Stiffness of unspecified foot, not elsewhere classified: Secondary | ICD-10-CM | POA: Insufficient documentation

## 2011-10-30 DIAGNOSIS — M25579 Pain in unspecified ankle and joints of unspecified foot: Secondary | ICD-10-CM | POA: Insufficient documentation

## 2011-11-01 ENCOUNTER — Ambulatory Visit: Payer: 59 | Admitting: Physical Therapy

## 2011-11-05 ENCOUNTER — Ambulatory Visit: Payer: 59 | Admitting: Physical Therapy

## 2011-11-07 ENCOUNTER — Encounter: Payer: 59 | Admitting: Physical Therapy

## 2011-11-08 ENCOUNTER — Ambulatory Visit: Payer: 59 | Admitting: Physical Therapy

## 2011-11-12 ENCOUNTER — Ambulatory Visit: Payer: 59 | Admitting: Physical Therapy

## 2011-11-14 ENCOUNTER — Encounter: Payer: 59 | Admitting: Physical Therapy

## 2011-11-15 ENCOUNTER — Ambulatory Visit: Payer: 59 | Admitting: Rehabilitation

## 2012-05-30 ENCOUNTER — Other Ambulatory Visit (HOSPITAL_COMMUNITY): Payer: Self-pay | Admitting: Otolaryngology

## 2012-05-30 DIAGNOSIS — H9191 Unspecified hearing loss, right ear: Secondary | ICD-10-CM

## 2012-06-03 ENCOUNTER — Ambulatory Visit (HOSPITAL_COMMUNITY): Payer: 59

## 2012-06-11 ENCOUNTER — Ambulatory Visit (HOSPITAL_COMMUNITY)
Admission: RE | Admit: 2012-06-11 | Discharge: 2012-06-11 | Disposition: A | Discharge status: Home / Self Care | Payer: 59 | Source: Ambulatory Visit | Attending: Otolaryngology | Admitting: Otolaryngology

## 2012-06-11 ENCOUNTER — Other Ambulatory Visit (HOSPITAL_COMMUNITY): Payer: 59

## 2012-06-11 DIAGNOSIS — H918X9 Other specified hearing loss, unspecified ear: Secondary | ICD-10-CM | POA: Insufficient documentation

## 2012-06-11 DIAGNOSIS — H9191 Unspecified hearing loss, right ear: Secondary | ICD-10-CM

## 2012-06-11 MED ORDER — GADOBENATE DIMEGLUMINE 529 MG/ML IV SOLN
15.0000 mL | Freq: Once | INTRAVENOUS | Status: AC
  Administered 2012-06-11: 15 mL via INTRAVENOUS

## 2012-06-13 ENCOUNTER — Other Ambulatory Visit (HOSPITAL_COMMUNITY): Payer: Self-pay | Admitting: Otolaryngology

## 2012-06-13 DIAGNOSIS — R590 Localized enlarged lymph nodes: Secondary | ICD-10-CM

## 2012-06-16 ENCOUNTER — Ambulatory Visit (HOSPITAL_COMMUNITY): Payer: 59

## 2012-06-17 ENCOUNTER — Ambulatory Visit (HOSPITAL_COMMUNITY): Admission: RE | Admit: 2012-06-17 | Payer: 59 | Source: Ambulatory Visit

## 2013-01-02 IMAGING — RF DG ANKLE 2V *L*
1 series · 2 of 2 positions shown · non-contrast
Comparison: 12/19/2010

CLINICAL DATA: Left ankle arthroplasty.

LEFT ANKLE - 2 VIEW

[Series 1: run · 2 of 2 slices shown]
[im 1/2]
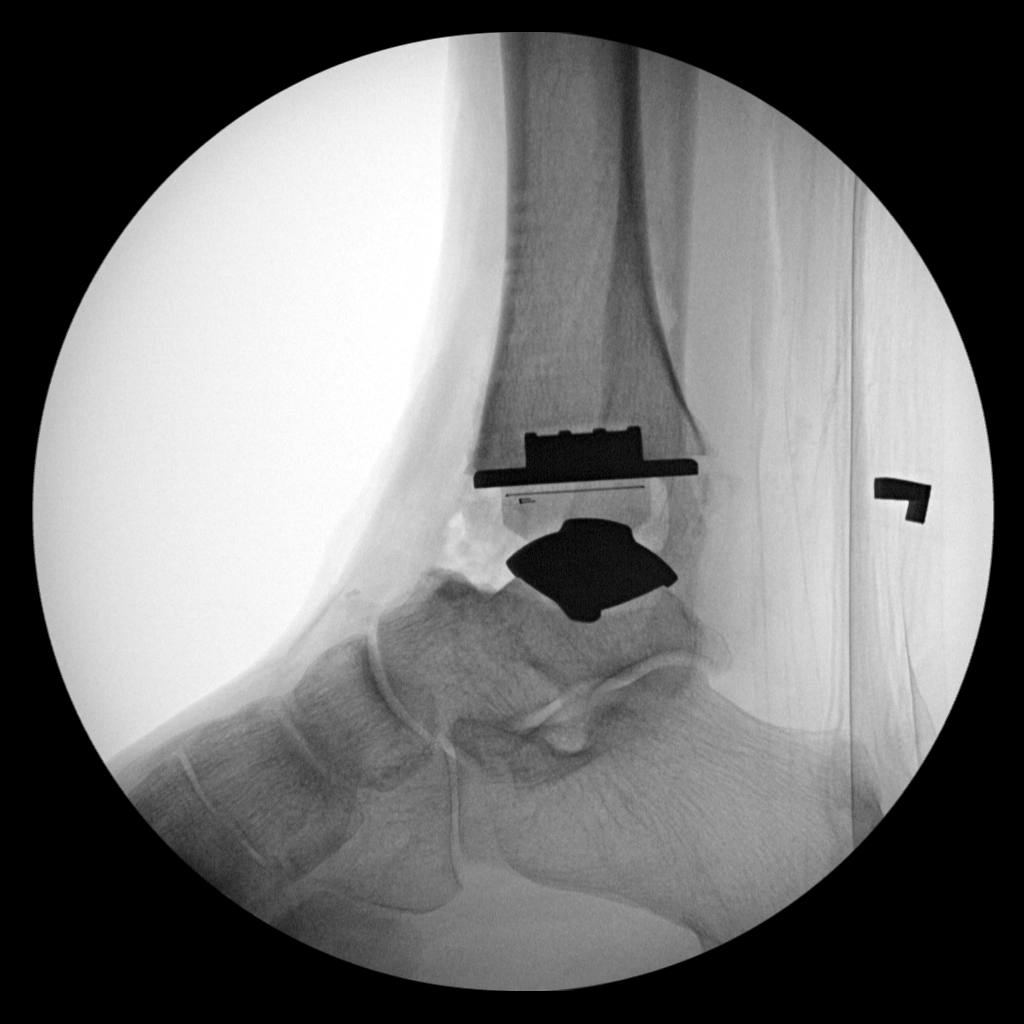
[im 2/2]
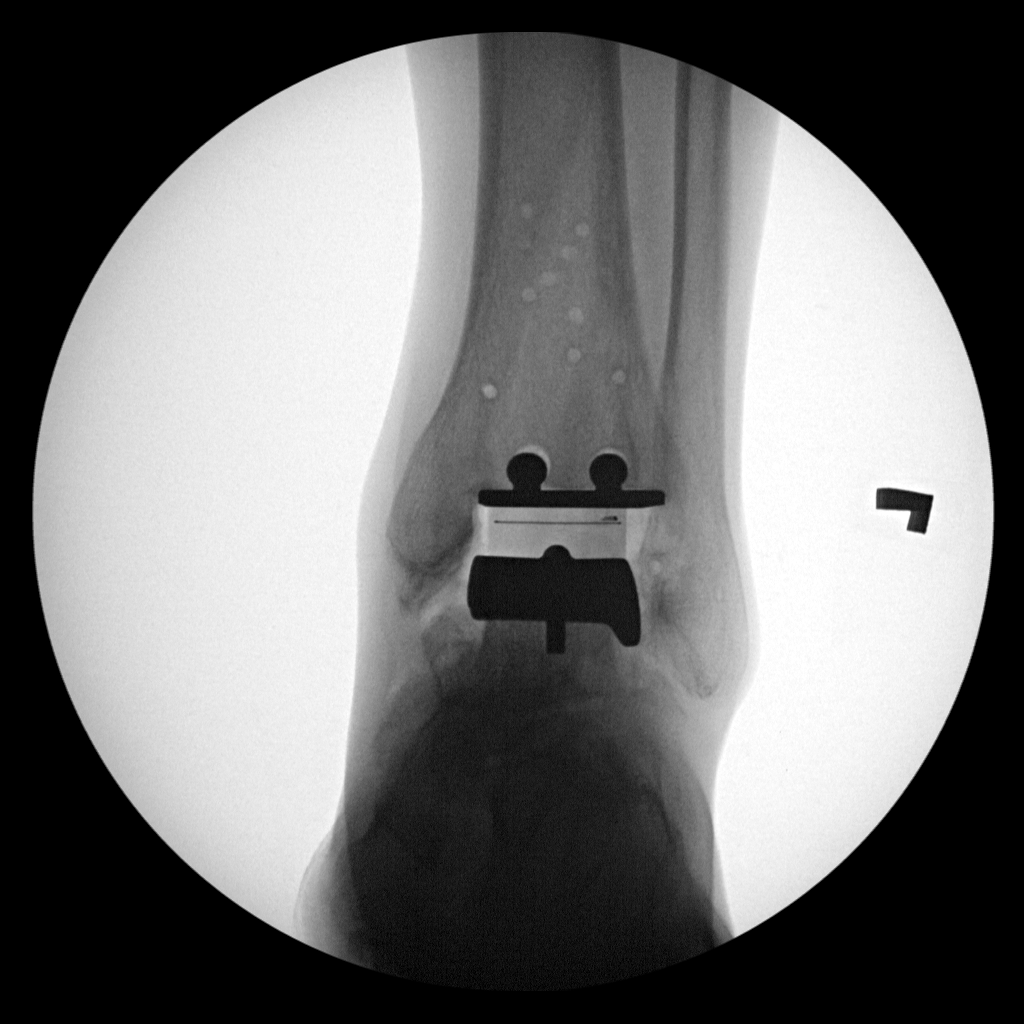

[2 of 2 positions shown; findings below may reference images not displayed]

FINDINGS: Two intraoperative radiographs now show placement of a
ankle arthroplasty in appropriate position.  The talus is centered
within the ankle mortise.  There is no evidence of fracture or
subluxation.
IMPRESSION: Expected postoperative appearance of ankle arthroplasty.  No
evidence of fracture or subluxation.

## 2013-02-13 IMAGING — RF DG ANKLE 2V *R*
1 series · 2 of 2 positions shown · non-contrast
Comparison: 12/19/2010.

CLINICAL DATA: Postop total ankle arthroplasty.

RIGHT ANKLE - 2 VIEW

[Series 1: run · 2 of 2 slices shown]
[im 1/2]
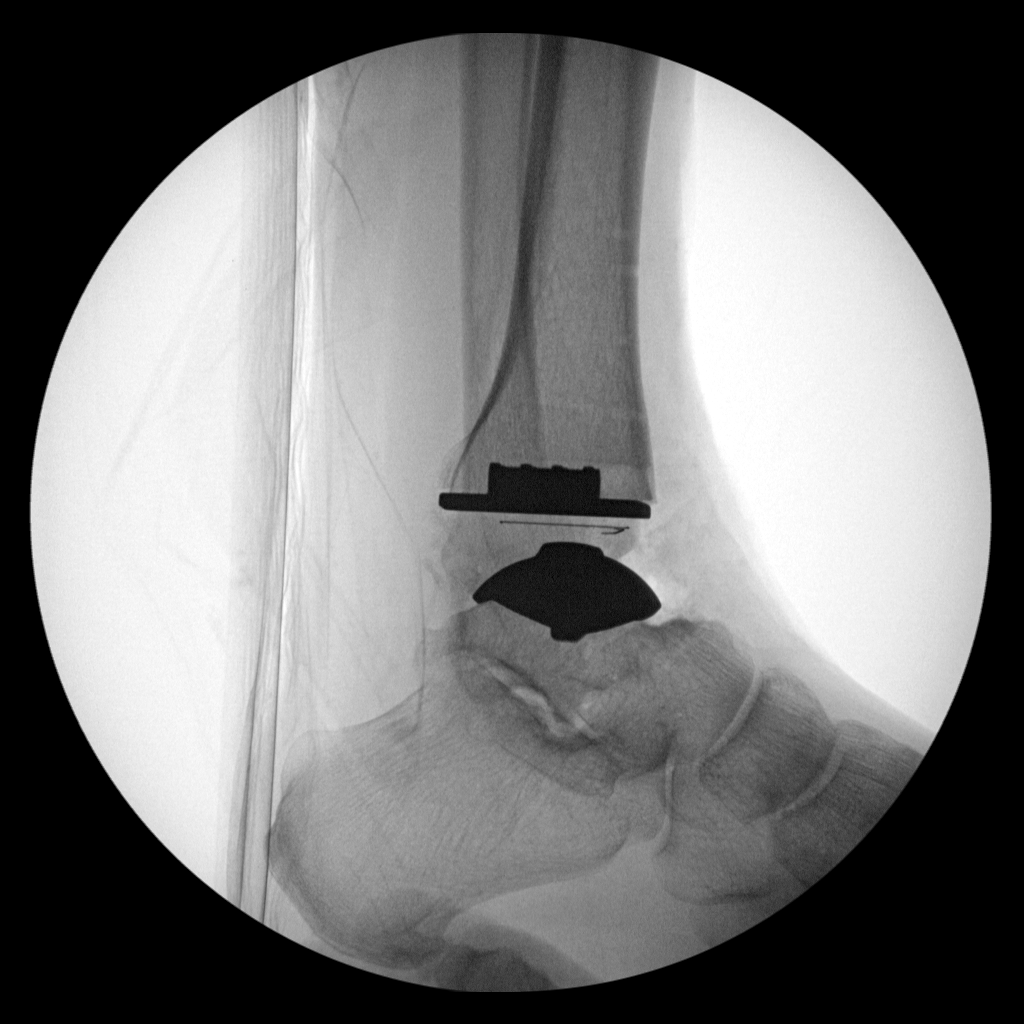
[im 2/2]
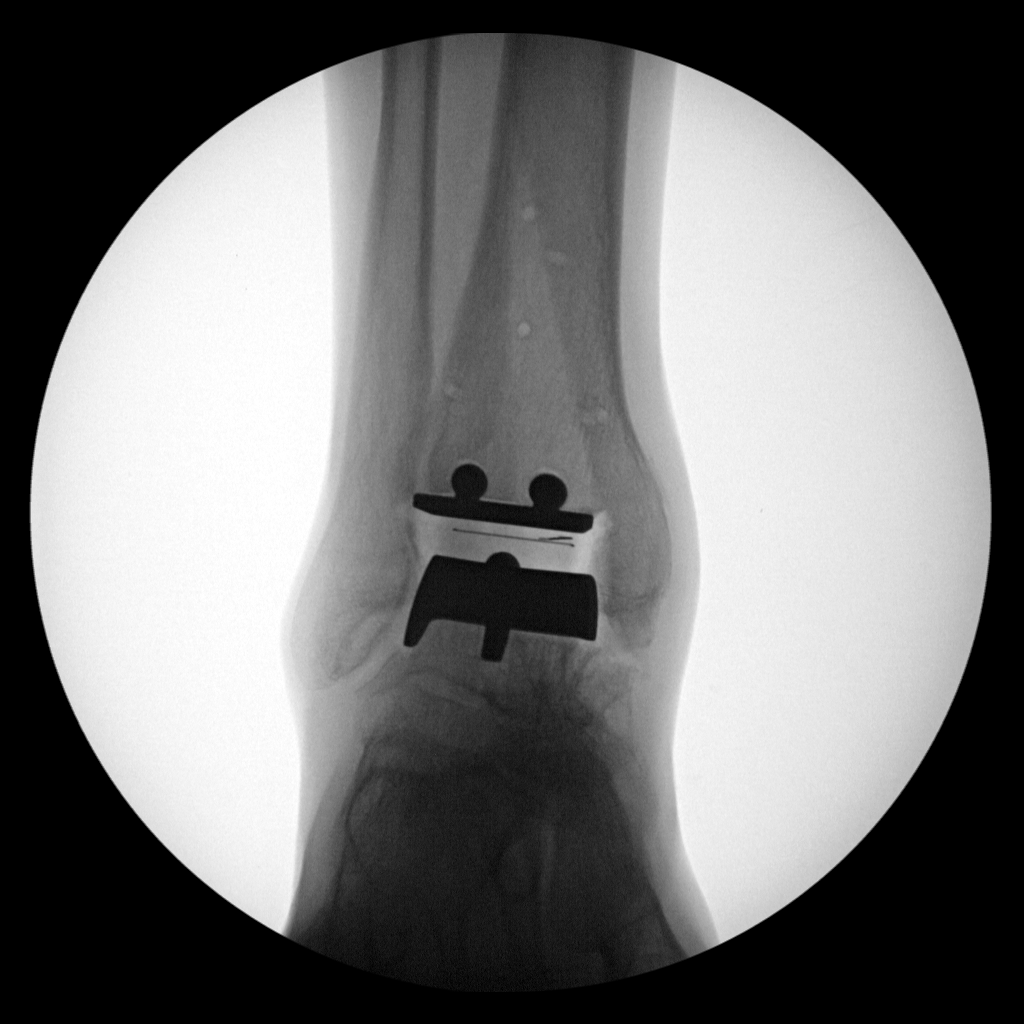

[2 of 2 positions shown; findings below may reference images not displayed]

FINDINGS: Two intraoperative fluoroscopic spot views of the ankle
are submitted.  There is no left or right image annotation.  There
are postoperative changes of ankle arthroplasty.
IMPRESSION: Intraoperative visualization of ankle arthroplasty.

## 2015-07-26 MED FILL — DRESSING SUPPLIES: 30 days supply | Qty: 1 | Fill #0

## 2015-07-26 MED FILL — BeneFIX 3000 UNIT KIT: 3000 | 30 days supply | Qty: 90000 | Fill #0

## 2015-07-26 MED FILL — BeneFIX 1000 UNIT KIT: 1000 | 30 days supply | Qty: 15000 | Fill #0

## 2015-08-01 DIAGNOSIS — D333 Benign neoplasm of cranial nerves: Secondary | ICD-10-CM | POA: Diagnosis not present

## 2015-08-02 MED FILL — DRESSING SUPPLIES: 10 days supply | Qty: 3 | Fill #0

## 2015-08-08 DIAGNOSIS — H10811 Pingueculitis, right eye: Secondary | ICD-10-CM | POA: Diagnosis not present

## 2015-08-08 MED FILL — FLUOROMETHOLONE 0.1% DROPS: 0.1 | 7 days supply | Qty: 5 | Fill #0

## 2015-08-12 DIAGNOSIS — D67 Hereditary factor IX deficiency: Secondary | ICD-10-CM | POA: Diagnosis not present

## 2015-08-12 DIAGNOSIS — D333 Benign neoplasm of cranial nerves: Secondary | ICD-10-CM | POA: Diagnosis not present

## 2015-08-16 MED FILL — IDELVION 2000 UNIT SOLR: 2000 | 30 days supply | Qty: 24068 | Fill #0

## 2015-08-17 DIAGNOSIS — D67 Hereditary factor IX deficiency: Secondary | ICD-10-CM | POA: Diagnosis not present

## 2015-08-25 MED FILL — IDELVION 1000 UNIT SOLR: 1000 | 28 days supply | Qty: 3465 | Fill #0

## 2015-08-31 DIAGNOSIS — D67 Hereditary factor IX deficiency: Secondary | ICD-10-CM | POA: Diagnosis not present

## 2015-11-07 DIAGNOSIS — D333 Benign neoplasm of cranial nerves: Secondary | ICD-10-CM | POA: Diagnosis not present

## 2015-11-07 DIAGNOSIS — D66 Hereditary factor VIII deficiency: Secondary | ICD-10-CM | POA: Diagnosis not present

## 2015-11-07 DIAGNOSIS — Z8619 Personal history of other infectious and parasitic diseases: Secondary | ICD-10-CM | POA: Diagnosis not present

## 2015-11-07 DIAGNOSIS — Z5181 Encounter for therapeutic drug level monitoring: Secondary | ICD-10-CM | POA: Diagnosis not present

## 2015-11-07 DIAGNOSIS — I10 Essential (primary) hypertension: Secondary | ICD-10-CM | POA: Diagnosis not present

## 2015-11-07 DIAGNOSIS — B20 Human immunodeficiency virus [HIV] disease: Secondary | ICD-10-CM | POA: Diagnosis not present

## 2015-11-07 DIAGNOSIS — Z129 Encounter for screening for malignant neoplasm, site unspecified: Secondary | ICD-10-CM | POA: Diagnosis not present

## 2015-11-07 DIAGNOSIS — Z0000 Encounter for general adult medical examination without abnormal findings: Secondary | ICD-10-CM | POA: Diagnosis not present

## 2015-11-16 MED FILL — RESTASIS 0.05% EYE EMULSION: 0.05 | 90 days supply | Qty: 180 | Fill #0

## 2015-11-25 DIAGNOSIS — Z471 Aftercare following joint replacement surgery: Secondary | ICD-10-CM | POA: Diagnosis not present

## 2015-11-25 DIAGNOSIS — Z96661 Presence of right artificial ankle joint: Secondary | ICD-10-CM | POA: Diagnosis not present

## 2015-11-25 DIAGNOSIS — Z96662 Presence of left artificial ankle joint: Secondary | ICD-10-CM | POA: Diagnosis not present

## 2015-12-05 ENCOUNTER — Other Ambulatory Visit: Payer: Self-pay | Admitting: *Deleted

## 2015-12-05 NOTE — Patient Outreach (Signed)
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 Message left on home number requesting return call. Will attempt another phone outreach at a later date. Barrington Ellison RN,CCM,CDE [place] Management Coordinator [facility] Office Phone [phone] Office Fax [phone]

## 2015-12-21 ENCOUNTER — Other Ambulatory Visit: Payer: Self-pay | Admitting: *Deleted

## 2015-12-21 NOTE — Patient Outreach (Addendum)
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 Home number busy. Will attempt another phone outreach at a later date. Barrington Ellison RN,CCM,CDE [place] Management Coordinator [facility] Office Phone [phone] Office Fax [phone]

## 2015-12-23 ENCOUNTER — Other Ambulatory Visit: Payer: Self-pay | Admitting: *Deleted

## 2015-12-23 ENCOUNTER — Encounter: Payer: Self-pay | Admitting: *Deleted

## 2015-12-23 NOTE — Patient Outreach (Signed)
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 Will mail letter to [facility] home with information related to [facility] CM services and contact number should he wish to discuss services. Barrington Ellison RN,CCM,CDE [place] Management Coordinator [facility] Office Phone [phone] Office Fax [phone]

## 2016-01-23 MED FILL — LISINOPRIL 5 MG TABLET: 5 | 90 days supply | Qty: 90 | Fill #0

## 2016-04-19 MED FILL — LISINOPRIL 5 MG TABLET: 5 | 90 days supply | Qty: 90 | Fill #1

## 2016-05-07 DIAGNOSIS — B20 Human immunodeficiency virus [HIV] disease: Secondary | ICD-10-CM | POA: Diagnosis not present

## 2016-05-07 DIAGNOSIS — Z8619 Personal history of other infectious and parasitic diseases: Secondary | ICD-10-CM | POA: Diagnosis not present

## 2016-05-07 DIAGNOSIS — D66 Hereditary factor VIII deficiency: Secondary | ICD-10-CM | POA: Diagnosis not present

## 2016-05-07 DIAGNOSIS — I10 Essential (primary) hypertension: Secondary | ICD-10-CM | POA: Diagnosis not present

## 2016-07-09 MED FILL — RESTASIS 0.05% EYE EMULSION: 0.05 | 90 days supply | Qty: 180 | Fill #1

## 2016-07-09 MED FILL — LISINOPRIL 5 MG TABLET: 5 | 90 days supply | Qty: 90 | Fill #2

## 2016-10-05 MED FILL — RESTASIS 0.05% EYE EMULSION: 0.05 | 90 days supply | Qty: 180 | Fill #2

## 2016-10-05 MED FILL — LISINOPRIL 5 MG TABLET: 5 | 90 days supply | Qty: 90 | Fill #3

## 2016-12-31 ENCOUNTER — Other Ambulatory Visit (HOSPITAL_COMMUNITY): Payer: Self-pay | Admitting: Otolaryngology

## 2016-12-31 DIAGNOSIS — H9191 Unspecified hearing loss, right ear: Secondary | ICD-10-CM

## 2017-01-03 DIAGNOSIS — H9041 Sensorineural hearing loss, unilateral, right ear, with unrestricted hearing on the contralateral side: Secondary | ICD-10-CM | POA: Diagnosis not present

## 2017-01-09 ENCOUNTER — Ambulatory Visit (HOSPITAL_COMMUNITY)
Admission: RE | Admit: 2017-01-09 | Discharge: 2017-01-09 | Disposition: A | Discharge status: Home / Self Care | Payer: 59 | Source: Ambulatory Visit | Attending: Otolaryngology | Admitting: Otolaryngology

## 2017-01-09 DIAGNOSIS — H918X1 Other specified hearing loss, right ear: Secondary | ICD-10-CM | POA: Diagnosis present

## 2017-01-09 DIAGNOSIS — D333 Benign neoplasm of cranial nerves: Secondary | ICD-10-CM | POA: Diagnosis not present

## 2017-01-09 DIAGNOSIS — H9191 Unspecified hearing loss, right ear: Secondary | ICD-10-CM

## 2017-01-09 LAB — CREATININE, SERUM
Creatinine, Ser: 1.16 mg/dL (ref 0.61–1.24)
GFR calc Af Amer: >60 mL/min (ref >60)
GFR calc non Af Amer: >60 mL/min (ref >60)

## 2017-01-09 MED ORDER — GADOBENATE DIMEGLUMINE 529 MG/ML IV SOLN
15.0000 mL | Freq: Once | INTRAVENOUS | Status: AC | PRN
  Administered 2017-01-09: 15 mL via INTRAVENOUS

## 2017-01-09 MED FILL — LISINOPRIL 5 MG TABLET: 5 | 90 days supply | Qty: 90 | Fill #0

## 2017-01-10 DIAGNOSIS — D333 Benign neoplasm of cranial nerves: Secondary | ICD-10-CM | POA: Diagnosis not present

## 2017-01-10 DIAGNOSIS — H9041 Sensorineural hearing loss, unilateral, right ear, with unrestricted hearing on the contralateral side: Secondary | ICD-10-CM | POA: Diagnosis not present

## 2017-01-17 DIAGNOSIS — D333 Benign neoplasm of cranial nerves: Secondary | ICD-10-CM | POA: Diagnosis not present

## 2017-01-17 DIAGNOSIS — H905 Unspecified sensorineural hearing loss: Secondary | ICD-10-CM | POA: Diagnosis not present

## 2017-02-18 DIAGNOSIS — Z23 Encounter for immunization: Secondary | ICD-10-CM | POA: Diagnosis not present

## 2017-02-18 DIAGNOSIS — I10 Essential (primary) hypertension: Secondary | ICD-10-CM | POA: Diagnosis not present

## 2017-02-18 DIAGNOSIS — B20 Human immunodeficiency virus [HIV] disease: Secondary | ICD-10-CM | POA: Diagnosis not present

## 2017-02-18 DIAGNOSIS — Z5181 Encounter for therapeutic drug level monitoring: Secondary | ICD-10-CM | POA: Diagnosis not present

## 2017-02-18 DIAGNOSIS — B182 Chronic viral hepatitis C: Secondary | ICD-10-CM | POA: Diagnosis not present

## 2017-03-20 DIAGNOSIS — B182 Chronic viral hepatitis C: Secondary | ICD-10-CM | POA: Diagnosis not present

## 2017-03-20 DIAGNOSIS — K7460 Unspecified cirrhosis of liver: Secondary | ICD-10-CM | POA: Diagnosis not present

## 2017-03-20 DIAGNOSIS — R932 Abnormal findings on diagnostic imaging of liver and biliary tract: Secondary | ICD-10-CM | POA: Diagnosis not present

## 2017-04-03 MED FILL — LISINOPRIL 5 MG TABLET: 5 | 90 days supply | Qty: 90 | Fill #1

## 2017-07-18 MED FILL — LISINOPRIL 5 MG TABLET: 5 | 90 days supply | Qty: 90 | Fill #2

## 2017-08-09 ENCOUNTER — Ambulatory Visit (HOSPITAL_BASED_OUTPATIENT_CLINIC_OR_DEPARTMENT_OTHER): Payer: 59 | Admitting: Pharmacist

## 2017-08-09 DIAGNOSIS — Z79899 Other long term (current) drug therapy: Secondary | ICD-10-CM

## 2017-08-09 MED ORDER — ABACAVIR-DOLUTEGRAVIR-LAMIVUD 600-50-300 MG PO TABS: 1.0000 | ORAL_TABLET | Freq: Every day | ORAL | 2 refills | Status: DC

## 2017-08-09 MED FILL — TRIUMEQ 600-50-300 MG TABS: 600-50-300 | 30 days supply | Qty: 30 | Fill #0

## 2017-08-09 NOTE — Progress Notes (Signed)
S: Patient presents to [facility] for review of their specialty medication therapy.    Patient is currently taking Triumeq for HIV. Patient is managed by Dr. Abner Greenspan at [facility] for this.   Of note, his wife is also HIV positive and treated with Triumeq.  He used to be on Atripla but was switched to Triumeq over a year ago. He likes Triumeq much better because he doesn't have any abnormal dreams now like he did on the Atripla.  Adherence: denies any missed doses  Dosing:  HIV-1 infection: Oral: One tablet once daily.  Dosing: Renal Impairment  CrCl ? 50 mL/minute: No dosage adjustment necessary. CrCl <50 mL/minute: Not recommended.  Dosing: Hepatic Impairment  Mild hepatic impairment (Child-Pugh class A): No dosage adjustment necessary; use with caution. Moderate or severe hepatic impairment (Child-Pugh class B, C): use is contraindicated  Drug-drug interactions: none  Monitoring: HIV RNA: not detectable CD4 count: 427 (L) S/sx of opportunistic infections: denies HLA-B*5701: absent Hypersensitivity: denies LFTs: WNL Renal function: WNL Liver function: WNL  O:     Lab Results  Component Value Date   WBC 6.2 09/06/2011   HGB 11.6 (L) 09/06/2011   HCT 34.2 (L) 09/06/2011   MCV 81.0 09/06/2011   PLT 140 (L) 09/06/2011      Chemistry      Component Value Date/Time   NA 140 07/23/2011 0941   K 4.0 07/23/2011 0941   CL 105 07/23/2011 0941   CO2 27 07/23/2011 0941   BUN 16 07/23/2011 0941   CREATININE 1.16 01/09/2017 1339      Component Value Date/Time   CALCIUM 8.8 07/23/2011 0941       No results found for: CD4TCELL, CD4TABS  No results found for: HIV1RNAQUANT   A/P: 1. Medication review: patient is on Triumeq for HIV and is doing well with no adverse effects. Labs WNL above. Reviewed the medication with him, including the following: the need for continued follow up and monitoring by physician at [facility] and 100% adherence to medication.  Reviewed possible adverse effects. No recommendations for any changes.  Christella Hartigan, PharmD, BCPS, BCACP, [place] [facility] [phone]

## 2017-09-02 DIAGNOSIS — B20 Human immunodeficiency virus [HIV] disease: Secondary | ICD-10-CM | POA: Diagnosis not present

## 2017-09-02 DIAGNOSIS — I10 Essential (primary) hypertension: Secondary | ICD-10-CM | POA: Diagnosis not present

## 2017-09-02 DIAGNOSIS — B182 Chronic viral hepatitis C: Secondary | ICD-10-CM | POA: Diagnosis not present

## 2017-09-02 MED FILL — TRIUMEQ 600-50-300 MG TABS: 600-50-300 | 30 days supply | Qty: 30 | Fill #1

## 2017-10-02 MED FILL — TRIUMEQ 600-50-300 MG TABS: 600-50-300 | 30 days supply | Qty: 30 | Fill #2

## 2017-10-28 MED FILL — LISINOPRIL 5 MG TABLET: 5 | 90 days supply | Qty: 90 | Fill #3

## 2017-10-31 ENCOUNTER — Other Ambulatory Visit: Payer: Self-pay | Admitting: Pharmacist

## 2017-10-31 MED ORDER — ABACAVIR-DOLUTEGRAVIR-LAMIVUD 600-50-300 MG PO TABS: 1.0000 | ORAL_TABLET | Freq: Every day | ORAL | 11 refills | Status: DC

## 2017-10-31 MED FILL — TRIUMEQ 600-50-300 MG TABS: 600-50-300 | 30 days supply | Qty: 30 | Fill #0

## 2017-12-03 MED FILL — TRIUMEQ 600-50-300 MG TABS: 600-50-300 | 30 days supply | Qty: 30 | Fill #1

## 2018-01-02 MED FILL — TRIUMEQ 600-50-300 MG TABS: 600-50-300 | 30 days supply | Qty: 30 | Fill #2

## 2018-01-20 MED FILL — LISINOPRIL 5 MG TABLET: 5 | 90 days supply | Qty: 90 | Fill #0

## 2018-01-27 MED FILL — TRIUMEQ 600-50-300 MG TABS: 600-50-300 | 30 days supply | Qty: 30 | Fill #3

## 2018-02-25 MED FILL — TRIUMEQ 600-50-300 MG TABS: 600-50-300 | 30 days supply | Qty: 30 | Fill #4

## 2018-03-25 MED FILL — TRIUMEQ 600-50-300 MG TABS: 600-50-300 | 30 days supply | Qty: 30 | Fill #5

## 2018-04-14 MED FILL — LISINOPRIL 5 MG TABLET: 5 | 90 days supply | Qty: 90 | Fill #1

## 2018-04-22 MED FILL — TRIUMEQ 600-50-300 MG TABS: 600-50-300 | 30 days supply | Qty: 30 | Fill #6

## 2018-04-22 MED FILL — SHIPPING COST: 1 days supply | Qty: 1 | Fill #0

## 2018-05-20 MED FILL — TRIUMEQ 600-50-300 MG TABS: 600-50-300 | 30 days supply | Qty: 30 | Fill #7

## 2018-05-21 MED FILL — SHIPPING COST: 1 days supply | Qty: 1 | Fill #1

## 2018-05-26 DIAGNOSIS — M25571 Pain in right ankle and joints of right foot: Secondary | ICD-10-CM | POA: Diagnosis not present

## 2018-05-26 DIAGNOSIS — M25572 Pain in left ankle and joints of left foot: Secondary | ICD-10-CM | POA: Diagnosis not present

## 2018-06-16 MED FILL — TRIUMEQ 600-50-300 MG TABS: 600-50-300 | 30 days supply | Qty: 30 | Fill #8

## 2018-06-16 MED FILL — SHIPPING COST: 1 days supply | Qty: 1 | Fill #2

## 2018-06-19 IMAGING — MR MR BRAIN/IAC WO/W
12 of 17 series · 28 of 48 positions shown · IV contrast (multihance)
Comparison: Brain MRI 06/11/2012.

CLINICAL DATA: 50-year-old male with right ear hearing loss, right
vestibular schwannoma.

EXAM:
MRI HEAD WITHOUT AND WITH CONTRAST
TECHNIQUE: Multiplanar, multiecho pulse sequences of the brain and surrounding
structures were obtained without and with intravenous contrast.
CONTRAST:  15mL MULTIHANCE GADOBENATE DIMEGLUMINE 529 MG/ML IV SOLN
The patient felt ill after contrast administration, but did not
experience pain, shortness of breath or other symptoms.

[Series 2: FLAIR · sagittal · 5.0mm · 0.47mm/px · 3 of 25 slices shown (1 of 2)]
[im 1/25]
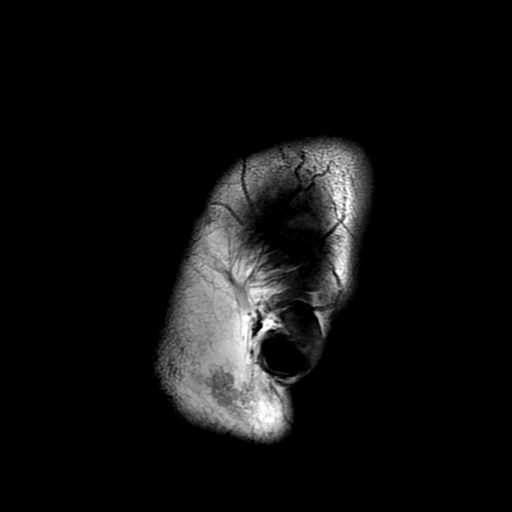
[im 13/25]
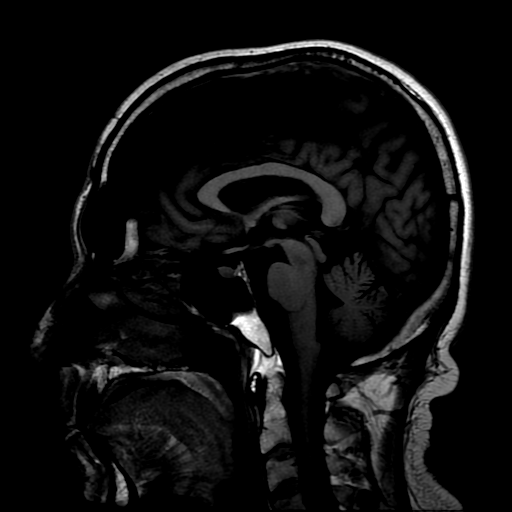
[im 25/25]
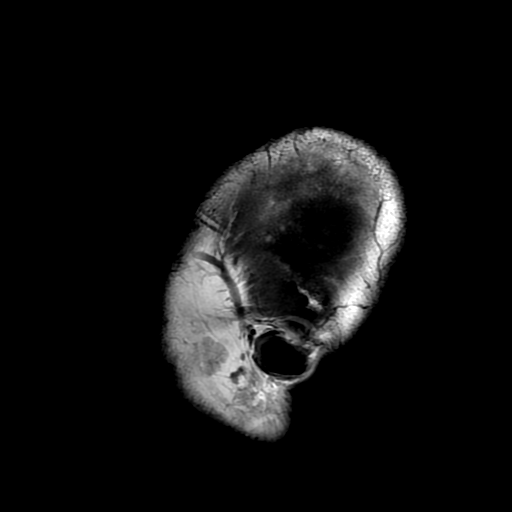

[Series 4: DWI · axial · 3.0mm · 0.94mm/px · z∈[-69,+80]mm · 6 of 102 slices shown (1 of 2)]
[im 1/102]
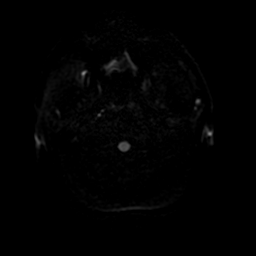
[im 21/102]
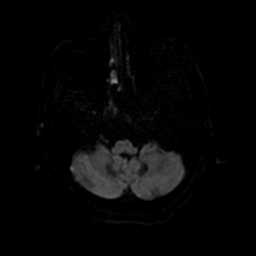
[im 41/102]
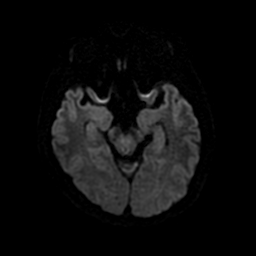
[im 61/102]
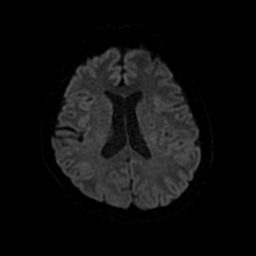
[im 81/102]
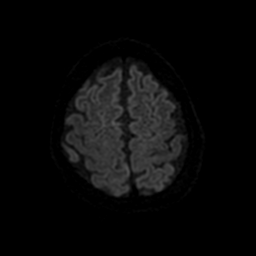
[im 102/102]
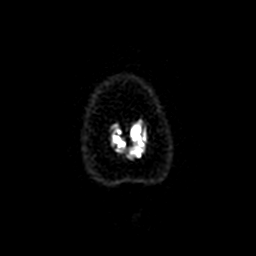

[Series 5: T2 · axial · 5.0mm · 0.47mm/px · z∈[-69,+80]mm · 2 of 26 slices shown (1 of 2)]
[im 1/26]
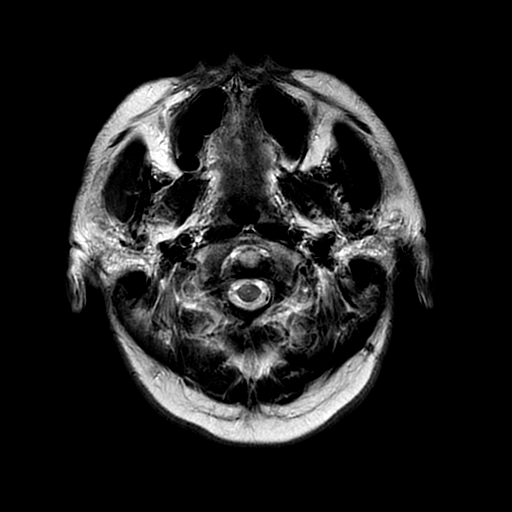
[im 26/26]
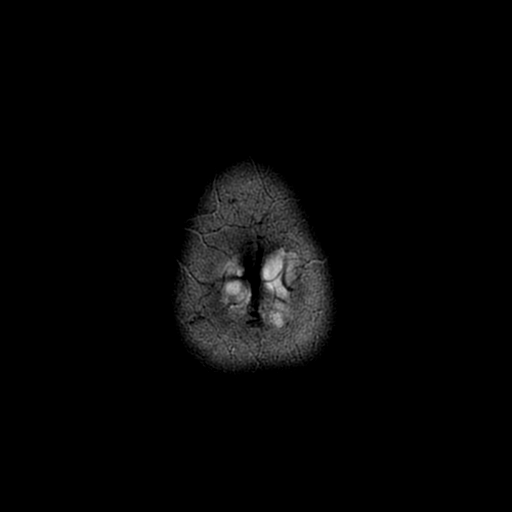

[Series 6: FLAIR · axial · 5.0mm · 0.47mm/px · z∈[-69,+80]mm · 2 of 26 slices shown (2 of 2)]
[im 1/26]
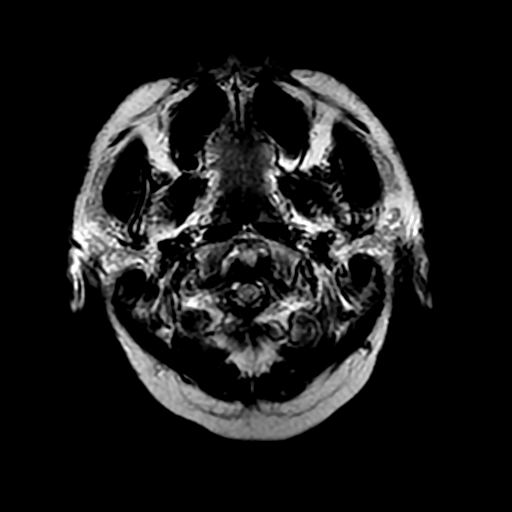
[im 26/26]
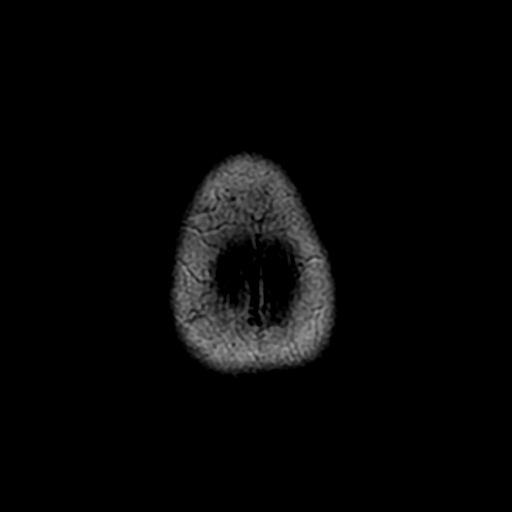

[Series 10: T2 · coronal · non-contrast · 5.0mm · 0.39mm/px · 2 of 30 slices shown (2 of 2)]
[im 1/30]
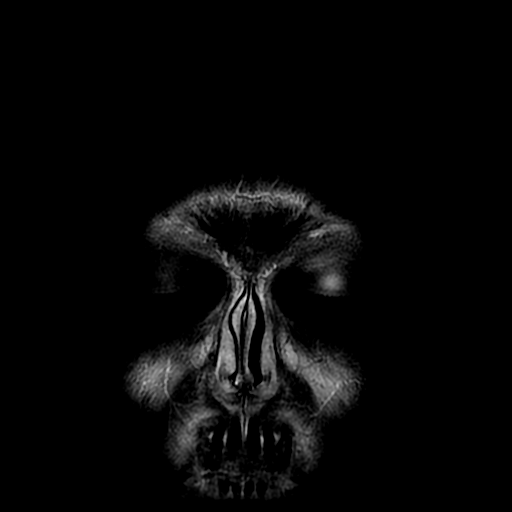
[im 30/30]
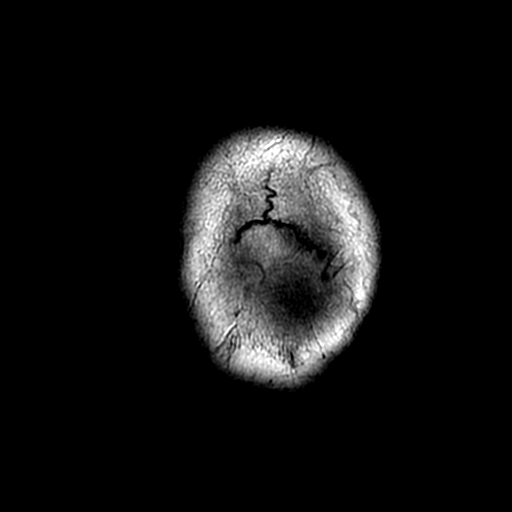

[Series 11: T1 · axial · 3.0mm · 0.35mm/px · 1 of 17 slices shown (1 of 4)]
[im 1/17]
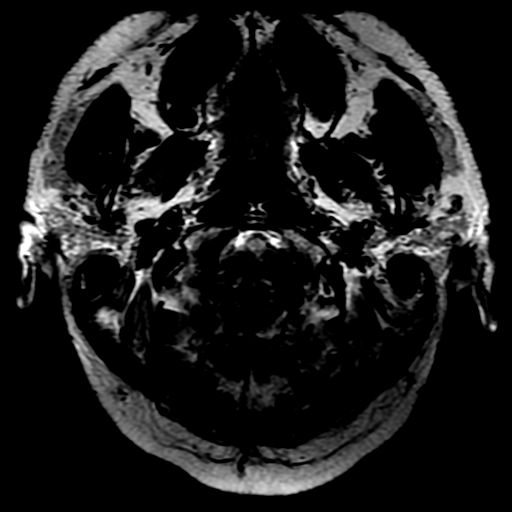

[Series 12: DWI · coronal · 4.0mm · 0.94mm/px · 4 of 71 slices shown (2 of 2)]
[im 1/71]
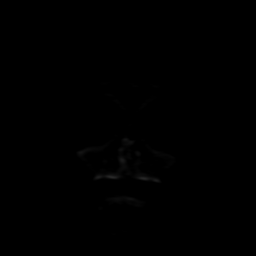
[im 24/71]
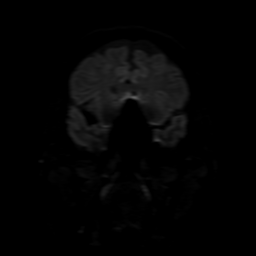
[im 47/71]
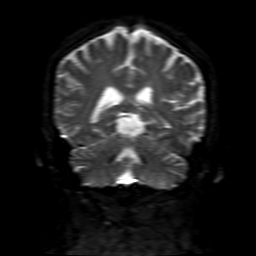
[im 71/71]
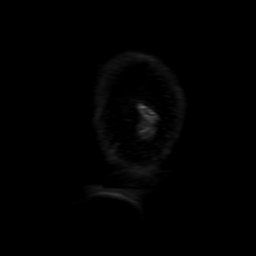

[Series 13: T1 · coronal · 3.0mm · 0.35mm/px · 1 of 19 slices shown (2 of 4)]
[im 1/19]
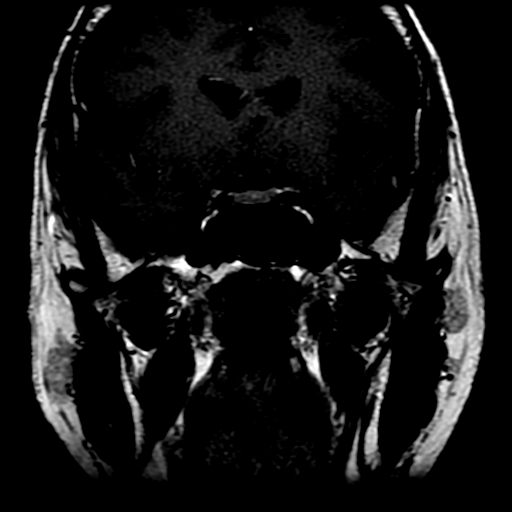

[Series 16: T1 · axial · 3.0mm · 0.35mm/px · 1 of 17 slices shown (3 of 4)]
[im 1/17]
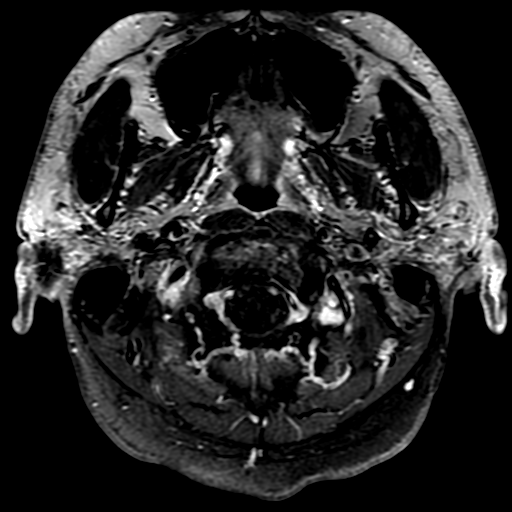

[Series 18: T1 · coronal · 3.0mm · 0.35mm/px · 1 of 19 slices shown (4 of 4)]
[im 1/19]
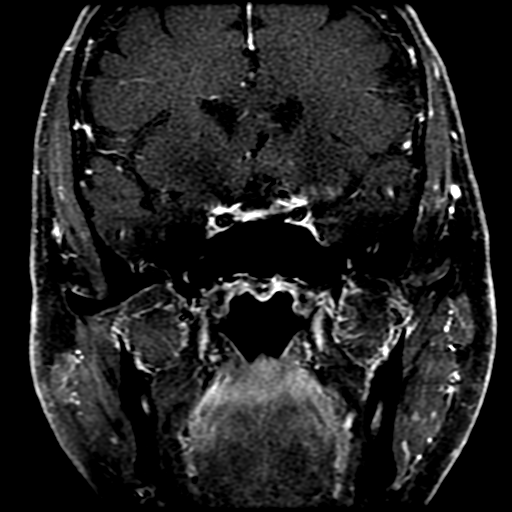

[Series 450: ADC · axial · 3.0mm · 0.94mm/px · z∈[-69,+80]mm · 3 of 50 slices shown (1 of 2)]
[im 1/50]
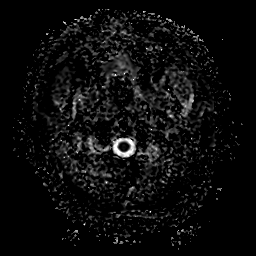
[im 25/50]
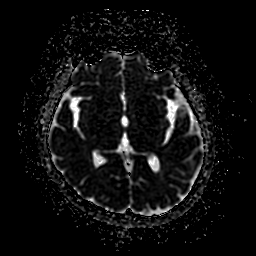
[im 50/50]
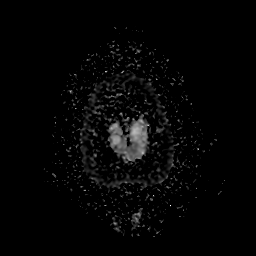

[Series 1250: ADC · coronal · 4.0mm · 0.94mm/px · 2 of 36 slices shown (2 of 2)]
[im 1/36]
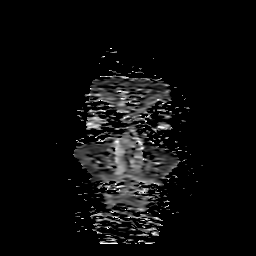
[im 36/36]
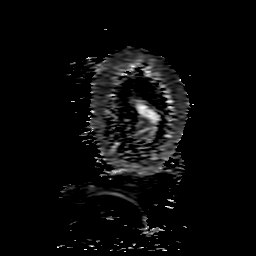

[28 of 48 positions shown; findings below may reference images not displayed]

FINDINGS: Brain: Stable cerebral volume. No restricted diffusion to suggest
acute infarction. No midline shift, ventriculomegaly, extra-axial
fluid collection or acute intracranial hemorrhage. Cervicomedullary
junction and pituitary are within normal limits.

Gray and white matter signal is stable and largely normal for age.
There is a small nonspecific chronic microhemorrhage in the right
frontal lobe centrum semiovale (series 7, image 74), unchanged since
2076. There is also hemosiderin associated with the right IAC mass,
detailed below. Outside of the right IAC findings, no abnormal
intracranial enhancement.

Vascular: Major intracranial vascular flow voids are stable since
2076.

Skull and upper cervical spine: Negative. Normal bone marrow signal.

Sinuses/Orbits: Normal orbit soft tissues. Paranasal sinuses remain
clear. Negative scalp soft tissues.

Other: Dedicated internal auditory canal imaging.

The chronic soft tissue mass tracking from the right
cerebellopontine angle through the right internal auditory canal to
the fundus of the right IAC persists but appears smaller than in
2076. No mass effect on the brainstem. The T2 hyperintense component
in the right CP angle appears contracted and partially hemorrhagic
now (series 5, image 7). There is persistent enhancement of the
lesion extending through to the fundus of the right IAC, and into
the basal turn of the right cochlea as before (series 16, image 9).
He enhancing mass today is 10 x 23 x 10 mm (AP by transverse by CC),
versus 13 x 25 x 11 mm on the prior MRI. T2 signal within the
cochlea appears mildly diminished since 2076 (Series 9, image 49).
Preserved T2 signal in the right vestibule and semicircular canals.
No other abnormal right middle ear enhancement.

The left cisternal and intracanalicular 7th and 8th cranial nerve
segments remain normal. Normal T2 signal in the left cochlea and
vestibular structures. No abnormal enhancement on that side.

Mastoids are clear.  Normal stylomastoid foramina.
IMPRESSION: 1. Mild regression of the right vestibular schwannoma since 2076,
with chronic blood products within the right CP angle portion of the
mass likely the sequelae of prior treatment. Involvement of the
basal turn of the right cochlea as before, with mildly diminished
fluid signal within the cochlea since [DATE]. No new right IAC abnormality.  Left IAC imaging remains normal.
3. Otherwise stable and negative MRI appearance of the brain.

## 2018-06-30 DIAGNOSIS — B182 Chronic viral hepatitis C: Secondary | ICD-10-CM | POA: Diagnosis not present

## 2018-06-30 DIAGNOSIS — I10 Essential (primary) hypertension: Secondary | ICD-10-CM | POA: Diagnosis not present

## 2018-06-30 DIAGNOSIS — B20 Human immunodeficiency virus [HIV] disease: Secondary | ICD-10-CM | POA: Diagnosis not present

## 2018-07-08 MED FILL — LISINOPRIL 5 MG TABLET: 5 | 90 days supply | Qty: 90 | Fill #2

## 2018-07-11 MED FILL — SHIPPING COST: 1 days supply | Qty: 1 | Fill #3

## 2018-07-11 MED FILL — TRIUMEQ 600-50-300 MG TABS: 600-50-300 | 30 days supply | Qty: 30 | Fill #9

## 2018-08-11 MED FILL — TRIUMEQ 600-50-300 MG TABS: 600-50-300 | 30 days supply | Qty: 30 | Fill #10

## 2018-08-11 MED FILL — SHIPPING COST: 1 days supply | Qty: 1 | Fill #4

## 2018-09-08 MED FILL — TRIUMEQ 600-50-300 MG TABS: 600-50-300 | 30 days supply | Qty: 30 | Fill #11

## 2018-09-09 MED FILL — SHIPPING COST: 1 days supply | Qty: 1 | Fill #5

## 2018-09-29 MED FILL — LISINOPRIL 5 MG TABLET: 5 | 90 days supply | Qty: 90 | Fill #3

## 2018-10-03 MED FILL — TRIUMEQ 600-50-300 MG TABS: 600-50-300 | 30 days supply | Qty: 30 | Fill #0

## 2018-10-03 MED FILL — SHIPPING COST: 1 days supply | Qty: 1 | Fill #6

## 2018-10-21 ENCOUNTER — Other Ambulatory Visit: Payer: Self-pay

## 2018-10-21 ENCOUNTER — Encounter: Payer: Self-pay | Admitting: Pharmacist

## 2018-10-21 ENCOUNTER — Ambulatory Visit (INDEPENDENT_AMBULATORY_CARE_PROVIDER_SITE_OTHER): Payer: 59 | Admitting: Pharmacist

## 2018-10-21 DIAGNOSIS — Z79899 Other long term (current) drug therapy: Secondary | ICD-10-CM

## 2018-10-21 NOTE — Progress Notes (Signed)
   S: Patient is currently taking Triumeq for HIV. Patient is managed by Dr. Abner Greenspan at [facility] for this.   Of note, his wife is also HIV positive and treated with Triumeq.  Efficacy: reports that it is still working well.  Adherence: denies any missed doses  Dosing:  HIV-1 infection: Oral: One tablet once daily.  Dosing: Renal Impairment  CrCl ? 50 mL/minute: No dosage adjustment necessary. CrCl <50 mL/minute: Not recommended.  Dosing: Hepatic Impairment  Mild hepatic impairment (Child-Pugh class A): No dosage adjustment necessary; use with caution. Moderate or severe hepatic impairment (Child-Pugh class B, C): use is contraindicated  Drug-drug interactions: none  Monitoring: HIV RNA: 47 CD4 count: 347 S/sx of opportunistic infections: denies HLA-B*5701: absent Hypersensitivity: denies LFTs: WNL Renal function: WNL   O:     Lab Results  Component Value Date   WBC 6.2 09/06/2011   HGB 11.6 (L) 09/06/2011   HCT 34.2 (L) 09/06/2011   MCV 81.0 09/06/2011   PLT 140 (L) 09/06/2011      Chemistry      Component Value Date/Time   NA 140 07/23/2011 0941   K 4.0 07/23/2011 0941   CL 105 07/23/2011 0941   CO2 27 07/23/2011 0941   BUN 16 07/23/2011 0941   CREATININE 1.16 01/09/2017 1339      Component Value Date/Time   CALCIUM 8.8 07/23/2011 0941       No results found for: CD4TCELL, CD4TABS  No results found for: HIV1RNAQUANT   A/P: 1. Medication review: patient is on Triumeq for HIV and is doing well with no adverse effects. Labs WNL above. Reviewed the medication with him, including the following: the need for continued follow up and monitoring by physician at [facility] and 100% adherence to medication. Reviewed possible adverse effects. No recommendations for any changes.  Christella Hartigan, PharmD, BCPS, BCACP, CPP Clinical Pharmacist Practitioner  [phone]

## 2018-10-31 ENCOUNTER — Other Ambulatory Visit: Payer: Self-pay | Admitting: Pharmacist

## 2018-10-31 MED ORDER — ABACAVIR-DOLUTEGRAVIR-LAMIVUD 600-50-300 MG PO TABS: 1.0000 | ORAL_TABLET | Freq: Every day | ORAL | 11 refills | Status: DC

## 2018-10-31 MED FILL — TRIUMEQ 600-50-300 MG TABS: 600-50-300 | 30 days supply | Qty: 30 | Fill #0

## 2018-12-02 MED FILL — TRIUMEQ 600-50-300 MG TABS: 600-50-300 | 30 days supply | Qty: 30 | Fill #1

## 2018-12-25 MED FILL — LISINOPRIL 5 MG TABLET: 5 | 90 days supply | Qty: 90 | Fill #0

## 2018-12-29 MED FILL — TRIUMEQ 600-50-300 MG TABS: 600-50-300 | 30 days supply | Qty: 30 | Fill #2

## 2019-01-28 MED FILL — TRIUMEQ 600-50-300 MG TABS: 600-50-300 | 30 days supply | Qty: 30 | Fill #3

## 2019-02-25 MED FILL — TRIUMEQ 600-50-300 MG TABS: 600-50-300 | 30 days supply | Qty: 30 | Fill #4

## 2019-03-25 MED FILL — TRIUMEQ 600-50-300 MG TABS: 600-50-300 | 30 days supply | Qty: 30 | Fill #5

## 2019-03-31 MED FILL — LISINOPRIL 5 MG TABLET: 5 | 90 days supply | Qty: 90 | Fill #1

## 2019-07-06 DIAGNOSIS — H9191 Unspecified hearing loss, right ear: Secondary | ICD-10-CM | POA: Diagnosis not present

## 2019-07-06 DIAGNOSIS — B20 Human immunodeficiency virus [HIV] disease: Secondary | ICD-10-CM | POA: Diagnosis not present

## 2019-07-06 DIAGNOSIS — Z823 Family history of stroke: Secondary | ICD-10-CM | POA: Diagnosis not present

## 2019-07-06 DIAGNOSIS — D67 Hereditary factor IX deficiency: Secondary | ICD-10-CM | POA: Diagnosis not present

## 2019-07-06 DIAGNOSIS — Z886 Allergy status to analgesic agent status: Secondary | ICD-10-CM | POA: Diagnosis not present

## 2019-07-06 DIAGNOSIS — Z79899 Other long term (current) drug therapy: Secondary | ICD-10-CM | POA: Diagnosis not present

## 2019-07-06 DIAGNOSIS — B1920 Unspecified viral hepatitis C without hepatic coma: Secondary | ICD-10-CM | POA: Diagnosis not present

## 2019-07-06 DIAGNOSIS — Z8673 Personal history of transient ischemic attack (TIA), and cerebral infarction without residual deficits: Secondary | ICD-10-CM | POA: Diagnosis not present

## 2019-07-06 DIAGNOSIS — I2510 Atherosclerotic heart disease of native coronary artery without angina pectoris: Secondary | ICD-10-CM | POA: Diagnosis not present

## 2019-07-06 DIAGNOSIS — I10 Essential (primary) hypertension: Secondary | ICD-10-CM | POA: Diagnosis not present

## 2019-07-06 MED FILL — LISINOPRIL 10 MG TABS: 10 | 30 days supply | Qty: 30 | Fill #0

## 2019-07-27 MED FILL — TRIUMEQ 600-50-300 MG TABS: 600-50-300 | 30 days supply | Qty: 30 | Fill #7

## 2019-08-05 MED FILL — LISINOPRIL 10 MG TABS: 10 | 90 days supply | Qty: 90 | Fill #1

## 2019-08-31 MED FILL — TRIUMEQ 600-50-300 MG TABS: 600-50-300 | 30 days supply | Qty: 30 | Fill #8

## 2019-09-25 MED FILL — TRIUMEQ 600-50-300 MG TABS: 600-50-300 | 30 days supply | Qty: 30 | Fill #9

## 2019-11-04 ENCOUNTER — Other Ambulatory Visit: Payer: Self-pay | Admitting: Internal Medicine

## 2019-11-04 MED FILL — LISINOPRIL 10 MG TABS: 10 | 90 days supply | Qty: 90 | Fill #2

## 2019-11-10 ENCOUNTER — Other Ambulatory Visit: Payer: Self-pay | Admitting: Pharmacist

## 2019-11-10 MED ORDER — TRIUMEQ 600-50-300 MG PO TABS: 1.0000 | ORAL_TABLET | Freq: Every day | ORAL | 11 refills | Status: DC

## 2019-11-10 MED FILL — TRIUMEQ 600-50-300 MG TABS: 600-50-300 | 30 days supply | Qty: 30 | Fill #0

## 2019-12-24 MED FILL — TRIUMEQ 600-50-300 MG TABS: 600-50-300 | 30 days supply | Qty: 30 | Fill #1

## 2020-01-22 MED FILL — TRIUMEQ 600-50-300 MG TABS: 600-50-300 | 30 days supply | Qty: 30 | Fill #2

## 2020-01-22 MED FILL — LISINOPRIL 10 MG TABS: 10 | 90 days supply | Qty: 90 | Fill #3

## 2020-02-17 MED FILL — TRIUMEQ 600-50-300 MG TABS: 600-50-300 | 30 days supply | Qty: 30 | Fill #3

## 2020-03-17 MED FILL — TRIUMEQ 600-50-300 MG TABS: 600-50-300 | 30 days supply | Qty: 30 | Fill #4

## 2020-04-15 MED FILL — TRIUMEQ 600-50-300 MG TABS: 600-50-300 | 30 days supply | Qty: 30 | Fill #5

## 2020-04-22 MED FILL — LISINOPRIL 10 MG TABS: 10 | 60 days supply | Qty: 60 | Fill #4

## 2020-05-13 MED FILL — TRIUMEQ 600-50-300 MG TABS: 600-50-300 | 30 days supply | Qty: 30 | Fill #6

## 2020-06-09 MED FILL — TRIUMEQ 600-50-300 MG TABS: 600-50-300 | 30 days supply | Qty: 30 | Fill #7

## 2020-06-13 ENCOUNTER — Other Ambulatory Visit (HOSPITAL_COMMUNITY): Payer: Self-pay | Admitting: Infectious Disease

## 2020-06-13 MED FILL — LISINOPRIL 10 MG TABS: 10 | 30 days supply | Qty: 30 | Fill #0

## 2020-07-04 DIAGNOSIS — B20 Human immunodeficiency virus [HIV] disease: Secondary | ICD-10-CM | POA: Diagnosis not present

## 2020-07-04 DIAGNOSIS — Z139 Encounter for screening, unspecified: Secondary | ICD-10-CM | POA: Diagnosis not present

## 2020-07-04 DIAGNOSIS — I10 Essential (primary) hypertension: Secondary | ICD-10-CM | POA: Diagnosis not present

## 2020-07-04 DIAGNOSIS — D67 Hereditary factor IX deficiency: Secondary | ICD-10-CM | POA: Diagnosis not present

## 2020-07-04 DIAGNOSIS — B182 Chronic viral hepatitis C: Secondary | ICD-10-CM | POA: Diagnosis not present

## 2020-07-04 DIAGNOSIS — E782 Mixed hyperlipidemia: Secondary | ICD-10-CM | POA: Diagnosis not present

## 2020-07-05 MED FILL — TRIUMEQ 600-50-300 MG TABS: 600-50-300 | 30 days supply | Qty: 30 | Fill #8

## 2020-07-09 MED FILL — LISINOPRIL 10 MG TABS: 10 | 30 days supply | Qty: 30 | Fill #1

## 2020-08-04 MED FILL — TRIUMEQ 600-50-300 MG TABS: 600-50-300 | 30 days supply | Qty: 30 | Fill #9

## 2020-08-05 MED FILL — LISINOPRIL 10 MG TABS: 10 | 30 days supply | Qty: 30 | Fill #2

## 2020-08-26 MED FILL — TRIUMEQ 600-50-300 MG TABS: 600-50-300 | 30 days supply | Qty: 30 | Fill #10

## 2020-09-02 MED FILL — LISINOPRIL 10 MG TABS: 10 | 30 days supply | Qty: 30 | Fill #3

## 2020-09-22 MED FILL — TRIUMEQ 600-50-300 MG TABS: 600-50-300 | 30 days supply | Qty: 30 | Fill #11

## 2020-10-01 MED FILL — LISINOPRIL 10 MG TABS: 10 | 30 days supply | Qty: 30 | Fill #4

## 2020-10-19 ENCOUNTER — Other Ambulatory Visit: Payer: Self-pay

## 2020-10-19 ENCOUNTER — Ambulatory Visit: Payer: 59 | Attending: Family Medicine | Admitting: Pharmacist

## 2020-10-19 ENCOUNTER — Other Ambulatory Visit: Payer: Self-pay | Admitting: Pharmacist

## 2020-10-19 DIAGNOSIS — Z79899 Other long term (current) drug therapy: Secondary | ICD-10-CM

## 2020-10-19 MED ORDER — TRIUMEQ 600-50-300 MG PO TABS: 1.0000 | ORAL_TABLET | Freq: Every day | ORAL | 10 refills | Status: DC

## 2020-10-19 NOTE — Progress Notes (Signed)
   S: Patient is currently taking Triumeq for HIV. Patient is managed by Dr. Abner Greenspan at [facility] for this.   Of note, his wife is also HIV positive and treated with Triumeq.  Efficacy: reports that it is still working well.  Adherence: denies any missed doses  Dosing:  HIV-1 infection: Oral: One tablet once daily.  Dosing: Renal Impairment  CrCl ? 50 mL/minute: No dosage adjustment necessary. CrCl <50 mL/minute: Not recommended.  Dosing: Hepatic Impairment  Mild hepatic impairment (Child-Pugh class A): No dosage adjustment necessary; use with caution. Moderate or severe hepatic impairment (Child-Pugh class B, C): use is contraindicated  Drug-drug interactions: none  Monitoring: HIV RNA: not detected (07/04/2020) CD4 count: 360 (07/04/2020) S/sx of opportunistic infections: denies HLA-B*5701: absent Hypersensitivity: denies LFTs: WNL Renal function: WNL  O: Lab Results  Component Value Date   WBC 6.2 09/06/2011   HGB 11.6 (L) 09/06/2011   HCT 34.2 (L) 09/06/2011   MCV 81.0 09/06/2011   PLT 140 (L) 09/06/2011      Chemistry      Component Value Date/Time   NA 140 07/23/2011 0941   K 4.0 07/23/2011 0941   CL 105 07/23/2011 0941   CO2 27 07/23/2011 0941   BUN 16 07/23/2011 0941   CREATININE 1.16 01/09/2017 1339      Component Value Date/Time   CALCIUM 8.8 07/23/2011 0941      A/P: 1. Medication review: patient is on Triumeq for HIV and is doing well with no adverse effects. Labs WNL above. Reviewed the medication with him, including the following: the need for continued follow up and monitoring by physician at [facility] and 100% adherence to medication. Reviewed possible adverse effects. No recommendations for any changes.  Benard Halsted, PharmD, [place], [place] [phone]

## 2020-10-20 ENCOUNTER — Other Ambulatory Visit (HOSPITAL_COMMUNITY): Payer: Self-pay

## 2020-10-28 ENCOUNTER — Other Ambulatory Visit (HOSPITAL_COMMUNITY): Payer: Self-pay

## 2020-10-28 MED FILL — Lisinopril Tab 10 MG: ORAL | 30 days supply | Qty: 30 | Fill #0 | Status: AC

## 2020-10-31 ENCOUNTER — Other Ambulatory Visit (HOSPITAL_COMMUNITY): Payer: Self-pay

## 2020-10-31 MED FILL — Abacavir-Dolutegravir-Lamivudine Tab 600-50-300 MG: ORAL | 30 days supply | Qty: 30 | Fill #0 | Status: CN

## 2020-11-01 ENCOUNTER — Other Ambulatory Visit (HOSPITAL_COMMUNITY): Payer: Self-pay

## 2020-11-01 ENCOUNTER — Telehealth: Payer: Self-pay

## 2020-11-01 MED FILL — Abacavir-Dolutegravir-Lamivudine Tab 600-50-300 MG: ORAL | 30 days supply | Qty: 30 | Fill #0 | Status: CN

## 2020-11-01 NOTE — Telephone Encounter (Signed)
[facility] Patient Advocate Encounter   I was successful in securing patient a $7500.00 grant from [employer] (PAF) to provide copayment coverage for [place].  This will make the out of pocket cost $0.00.     I have spoken with the patient.    The billing information is as follows and has been shared with [place].         Patient knows to call the office with questions or concerns.  Ileene Patrick, [facility] Patient [facility] for Infectious Disease Phone: [phone] Fax:  [phone]

## 2020-11-02 ENCOUNTER — Other Ambulatory Visit (HOSPITAL_COMMUNITY): Payer: Self-pay

## 2020-11-02 MED FILL — Abacavir-Dolutegravir-Lamivudine Tab 600-50-300 MG: ORAL | 30 days supply | Qty: 30 | Fill #0 | Status: AC

## 2020-11-22 ENCOUNTER — Other Ambulatory Visit (HOSPITAL_COMMUNITY): Payer: Self-pay

## 2020-11-22 MED FILL — Lisinopril Tab 10 MG: ORAL | 30 days supply | Qty: 30 | Fill #1 | Status: AC

## 2020-11-22 MED FILL — Abacavir-Dolutegravir-Lamivudine Tab 600-50-300 MG: ORAL | 30 days supply | Qty: 30 | Fill #1 | Status: AC

## 2020-11-28 ENCOUNTER — Other Ambulatory Visit (HOSPITAL_COMMUNITY): Payer: Self-pay

## 2020-12-21 ENCOUNTER — Other Ambulatory Visit (HOSPITAL_COMMUNITY): Payer: Self-pay

## 2020-12-26 ENCOUNTER — Other Ambulatory Visit (HOSPITAL_COMMUNITY): Payer: Self-pay

## 2020-12-26 MED FILL — Abacavir-Dolutegravir-Lamivudine Tab 600-50-300 MG: ORAL | 30 days supply | Qty: 30 | Fill #2 | Status: AC

## 2020-12-26 MED FILL — Lisinopril Tab 10 MG: ORAL | 30 days supply | Qty: 30 | Fill #2 | Status: AC

## 2021-01-19 ENCOUNTER — Other Ambulatory Visit (HOSPITAL_COMMUNITY): Payer: Self-pay

## 2021-01-24 ENCOUNTER — Other Ambulatory Visit (HOSPITAL_COMMUNITY): Payer: Self-pay

## 2021-01-24 MED FILL — Abacavir-Dolutegravir-Lamivudine Tab 600-50-300 MG: ORAL | 30 days supply | Qty: 30 | Fill #3 | Status: AC

## 2021-01-24 MED FILL — Lisinopril Tab 10 MG: ORAL | 30 days supply | Qty: 30 | Fill #3 | Status: AC

## 2021-02-14 ENCOUNTER — Other Ambulatory Visit (HOSPITAL_COMMUNITY): Payer: Self-pay

## 2021-02-14 MED FILL — Abacavir-Dolutegravir-Lamivudine Tab 600-50-300 MG: ORAL | 30 days supply | Qty: 30 | Fill #4 | Status: CN

## 2021-02-14 MED FILL — Lisinopril Tab 10 MG: ORAL | 30 days supply | Qty: 30 | Fill #4 | Status: AC

## 2021-02-16 ENCOUNTER — Other Ambulatory Visit (HOSPITAL_COMMUNITY): Payer: Self-pay

## 2021-02-20 ENCOUNTER — Other Ambulatory Visit (HOSPITAL_COMMUNITY): Payer: Self-pay

## 2021-02-21 ENCOUNTER — Other Ambulatory Visit (HOSPITAL_COMMUNITY): Payer: Self-pay

## 2021-02-21 MED FILL — Abacavir-Dolutegravir-Lamivudine Tab 600-50-300 MG: ORAL | 30 days supply | Qty: 30 | Fill #4 | Status: AC

## 2021-02-22 ENCOUNTER — Other Ambulatory Visit (HOSPITAL_COMMUNITY): Payer: Self-pay

## 2021-03-15 ENCOUNTER — Other Ambulatory Visit (HOSPITAL_COMMUNITY): Payer: Self-pay

## 2021-03-15 MED FILL — Lisinopril Tab 10 MG: ORAL | 30 days supply | Qty: 30 | Fill #5 | Status: AC

## 2021-03-15 MED FILL — Abacavir-Dolutegravir-Lamivudine Tab 600-50-300 MG: ORAL | 30 days supply | Qty: 30 | Fill #5 | Status: AC

## 2021-03-17 ENCOUNTER — Other Ambulatory Visit (HOSPITAL_COMMUNITY): Payer: Self-pay

## 2021-03-21 ENCOUNTER — Other Ambulatory Visit (HOSPITAL_COMMUNITY): Payer: Self-pay

## 2021-04-13 ENCOUNTER — Other Ambulatory Visit (HOSPITAL_COMMUNITY): Payer: Self-pay

## 2021-04-13 MED FILL — Abacavir-Dolutegravir-Lamivudine Tab 600-50-300 MG: ORAL | 30 days supply | Qty: 30 | Fill #6 | Status: AC

## 2021-04-13 MED FILL — Lisinopril Tab 10 MG: ORAL | 30 days supply | Qty: 30 | Fill #6 | Status: AC

## 2021-04-18 ENCOUNTER — Other Ambulatory Visit (HOSPITAL_COMMUNITY): Payer: Self-pay

## 2021-04-19 ENCOUNTER — Other Ambulatory Visit (HOSPITAL_COMMUNITY): Payer: Self-pay

## 2021-04-20 ENCOUNTER — Other Ambulatory Visit (HOSPITAL_COMMUNITY): Payer: Self-pay

## 2021-04-21 ENCOUNTER — Other Ambulatory Visit (HOSPITAL_COMMUNITY): Payer: Self-pay

## 2021-05-11 ENCOUNTER — Other Ambulatory Visit (HOSPITAL_COMMUNITY): Payer: Self-pay

## 2021-05-11 MED FILL — Abacavir-Dolutegravir-Lamivudine Tab 600-50-300 MG: ORAL | 30 days supply | Qty: 30 | Fill #7 | Status: AC

## 2021-05-12 ENCOUNTER — Other Ambulatory Visit (HOSPITAL_COMMUNITY): Payer: Self-pay

## 2021-05-12 MED ORDER — LISINOPRIL 10 MG PO TABS
10.0000 mg | ORAL_TABLET | Freq: Every day | ORAL | 11 refills | Status: AC
  Filled 2021-05-12: qty 30, 30d supply, fill #0
  Filled 2021-06-07: qty 30, 30d supply, fill #1
  Filled 2021-09-29: qty 30, 30d supply, fill #2
  Filled 2021-11-28: qty 30, 30d supply, fill #3
  Filled 2021-12-25: qty 30, 30d supply, fill #4
  Filled 2022-01-18: qty 30, 30d supply, fill #5
  Filled 2022-02-20: qty 30, 30d supply, fill #6
  Filled 2022-04-16: qty 30, 30d supply, fill #7

## 2021-05-15 ENCOUNTER — Other Ambulatory Visit (HOSPITAL_COMMUNITY): Payer: Self-pay

## 2021-05-16 ENCOUNTER — Other Ambulatory Visit (HOSPITAL_COMMUNITY): Payer: Self-pay

## 2021-06-07 ENCOUNTER — Other Ambulatory Visit (HOSPITAL_COMMUNITY): Payer: Self-pay

## 2021-06-07 MED FILL — Abacavir-Dolutegravir-Lamivudine Tab 600-50-300 MG: ORAL | 30 days supply | Qty: 30 | Fill #8 | Status: AC

## 2021-06-09 ENCOUNTER — Other Ambulatory Visit (HOSPITAL_COMMUNITY): Payer: Self-pay

## 2021-06-09 ENCOUNTER — Encounter (HOSPITAL_COMMUNITY): Payer: Self-pay

## 2021-06-19 ENCOUNTER — Other Ambulatory Visit (HOSPITAL_COMMUNITY): Payer: Self-pay

## 2021-07-03 ENCOUNTER — Other Ambulatory Visit (HOSPITAL_COMMUNITY): Payer: Self-pay

## 2021-07-03 DIAGNOSIS — Z139 Encounter for screening, unspecified: Secondary | ICD-10-CM | POA: Diagnosis not present

## 2021-07-03 DIAGNOSIS — Z23 Encounter for immunization: Secondary | ICD-10-CM | POA: Diagnosis not present

## 2021-07-03 DIAGNOSIS — B182 Chronic viral hepatitis C: Secondary | ICD-10-CM | POA: Diagnosis not present

## 2021-07-03 DIAGNOSIS — I10 Essential (primary) hypertension: Secondary | ICD-10-CM | POA: Diagnosis not present

## 2021-07-03 DIAGNOSIS — D67 Hereditary factor IX deficiency: Secondary | ICD-10-CM | POA: Diagnosis not present

## 2021-07-03 DIAGNOSIS — H9191 Unspecified hearing loss, right ear: Secondary | ICD-10-CM | POA: Diagnosis not present

## 2021-07-03 DIAGNOSIS — E782 Mixed hyperlipidemia: Secondary | ICD-10-CM | POA: Diagnosis not present

## 2021-07-03 DIAGNOSIS — B20 Human immunodeficiency virus [HIV] disease: Secondary | ICD-10-CM | POA: Diagnosis not present

## 2021-07-03 MED ORDER — TRIUMEQ 600-50-300 MG PO TABS: 1.0000 | ORAL_TABLET | Freq: Every day | ORAL | 11 refills | Status: DC

## 2021-07-03 MED ORDER — LISINOPRIL 10 MG PO TABS
10.0000 mg | ORAL_TABLET | Freq: Every day | ORAL | 11 refills | Status: DC
  Filled 2021-07-03: qty 30, 30d supply, fill #0
  Filled 2021-08-16: qty 30, 30d supply, fill #1
  Filled 2021-09-08: qty 30, 30d supply, fill #2
  Filled 2021-11-07: qty 30, 30d supply, fill #3
  Filled 2022-03-21: qty 30, 30d supply, fill #4
  Filled 2022-05-17: qty 30, 30d supply, fill #5
  Filled 2022-06-12 – 2022-06-19 (×2): qty 30, 30d supply, fill #6

## 2021-07-05 ENCOUNTER — Other Ambulatory Visit (HOSPITAL_COMMUNITY): Payer: Self-pay

## 2021-07-05 MED FILL — Abacavir-Dolutegravir-Lamivudine Tab 600-50-300 MG: ORAL | 30 days supply | Qty: 30 | Fill #9 | Status: AC

## 2021-07-10 ENCOUNTER — Other Ambulatory Visit (HOSPITAL_COMMUNITY): Payer: Self-pay

## 2021-07-11 ENCOUNTER — Other Ambulatory Visit (HOSPITAL_COMMUNITY): Payer: Self-pay

## 2021-07-18 ENCOUNTER — Other Ambulatory Visit (HOSPITAL_COMMUNITY): Payer: Self-pay

## 2021-08-09 ENCOUNTER — Other Ambulatory Visit (HOSPITAL_COMMUNITY): Payer: Self-pay

## 2021-08-09 MED FILL — Abacavir-Dolutegravir-Lamivudine Tab 600-50-300 MG: ORAL | 30 days supply | Qty: 30 | Fill #10 | Status: CN

## 2021-08-10 ENCOUNTER — Other Ambulatory Visit (HOSPITAL_COMMUNITY): Payer: Self-pay

## 2021-08-10 ENCOUNTER — Telehealth: Payer: Self-pay

## 2021-08-10 MED FILL — Abacavir-Dolutegravir-Lamivudine Tab 600-50-300 MG: ORAL | 30 days supply | Qty: 30 | Fill #10 | Status: AC

## 2021-08-10 MED FILL — Abacavir-Dolutegravir-Lamivudine Tab 600-50-300 MG: ORAL | 30 days supply | Qty: 30 | Fill #10 | Status: CN

## 2021-08-10 NOTE — Telephone Encounter (Signed)
[facility] Patient Advocate Encounter   Was successful in obtaining a [employer] copay card for Triumeq.  This copay card will make the patients copay 0.00.  I have spoken with the patient.    The billing information is as follows and has been shared with [place].  RxBin: [id number] PCN: [id number] Member ID: [id number] Group ID: [id number]  Ileene Patrick, [place] Patient [place] for Infectious Disease Phone: [phone] Fax:  [phone]

## 2021-08-16 ENCOUNTER — Other Ambulatory Visit (HOSPITAL_COMMUNITY): Payer: Self-pay

## 2021-08-17 ENCOUNTER — Other Ambulatory Visit (HOSPITAL_COMMUNITY): Payer: Self-pay

## 2021-08-29 ENCOUNTER — Other Ambulatory Visit (HOSPITAL_COMMUNITY): Payer: Self-pay

## 2021-08-30 ENCOUNTER — Other Ambulatory Visit (HOSPITAL_COMMUNITY): Payer: Self-pay

## 2021-08-31 ENCOUNTER — Other Ambulatory Visit: Payer: Self-pay | Admitting: Pharmacist

## 2021-08-31 ENCOUNTER — Other Ambulatory Visit (HOSPITAL_COMMUNITY): Payer: Self-pay

## 2021-08-31 DIAGNOSIS — B20 Human immunodeficiency virus [HIV] disease: Secondary | ICD-10-CM

## 2021-08-31 MED ORDER — TRIUMEQ 600-50-300 MG PO TABS
1.0000 | ORAL_TABLET | Freq: Every day | ORAL | 10 refills | Status: DC
  Filled 2021-08-31: qty 30, 30d supply, fill #0
  Filled 2021-09-29: qty 30, 30d supply, fill #1
  Filled 2021-11-07: qty 30, 30d supply, fill #2
  Filled 2021-11-28: qty 30, 30d supply, fill #3
  Filled 2021-12-25: qty 30, 30d supply, fill #4
  Filled 2022-01-18: qty 30, 30d supply, fill #5
  Filled 2022-02-20: qty 30, 30d supply, fill #6
  Filled 2022-03-21: qty 30, 30d supply, fill #7
  Filled 2022-04-16: qty 30, 30d supply, fill #8
  Filled 2022-05-17: qty 30, 30d supply, fill #9
  Filled 2022-06-12: qty 30, 30d supply, fill #10

## 2021-08-31 MED ORDER — TRIUMEQ 600-50-300 MG PO TABS: 1.0000 | ORAL_TABLET | Freq: Every day | ORAL | 10 refills | Status: DC

## 2021-09-04 ENCOUNTER — Other Ambulatory Visit (HOSPITAL_COMMUNITY): Payer: Self-pay

## 2021-09-08 ENCOUNTER — Other Ambulatory Visit (HOSPITAL_COMMUNITY): Payer: Self-pay

## 2021-09-09 ENCOUNTER — Other Ambulatory Visit (HOSPITAL_COMMUNITY): Payer: Self-pay

## 2021-09-27 ENCOUNTER — Other Ambulatory Visit (HOSPITAL_COMMUNITY): Payer: Self-pay

## 2021-09-29 ENCOUNTER — Other Ambulatory Visit (HOSPITAL_COMMUNITY): Payer: Self-pay

## 2021-10-02 ENCOUNTER — Other Ambulatory Visit (HOSPITAL_COMMUNITY): Payer: Self-pay

## 2021-10-05 ENCOUNTER — Other Ambulatory Visit (HOSPITAL_COMMUNITY): Payer: Self-pay

## 2021-11-07 ENCOUNTER — Other Ambulatory Visit (HOSPITAL_COMMUNITY): Payer: Self-pay

## 2021-11-28 ENCOUNTER — Other Ambulatory Visit (HOSPITAL_COMMUNITY): Payer: Self-pay

## 2021-12-04 ENCOUNTER — Other Ambulatory Visit (HOSPITAL_COMMUNITY): Payer: Self-pay

## 2021-12-25 ENCOUNTER — Other Ambulatory Visit (HOSPITAL_COMMUNITY): Payer: Self-pay

## 2022-01-01 ENCOUNTER — Other Ambulatory Visit (HOSPITAL_COMMUNITY): Payer: Self-pay

## 2022-01-18 ENCOUNTER — Other Ambulatory Visit (HOSPITAL_COMMUNITY): Payer: Self-pay

## 2022-01-29 ENCOUNTER — Other Ambulatory Visit (HOSPITAL_COMMUNITY): Payer: Self-pay

## 2022-02-20 ENCOUNTER — Telehealth: Payer: Self-pay | Admitting: Pharmacist

## 2022-02-20 ENCOUNTER — Other Ambulatory Visit (HOSPITAL_COMMUNITY): Payer: Self-pay

## 2022-02-20 NOTE — Telephone Encounter (Signed)
Called patient to discuss HIV medication, Triumeq for [employer] Employee Specialty Medication Program. No answer, no option to leave a VM.  Sahib Pella L. Ophelia Sipe, PharmD [facility] Clinical Pharmacist Practitioner

## 2022-02-21 ENCOUNTER — Other Ambulatory Visit (HOSPITAL_COMMUNITY): Payer: Self-pay

## 2022-02-26 ENCOUNTER — Other Ambulatory Visit (HOSPITAL_COMMUNITY): Payer: Self-pay

## 2022-03-21 ENCOUNTER — Other Ambulatory Visit (HOSPITAL_COMMUNITY): Payer: Self-pay

## 2022-03-27 ENCOUNTER — Other Ambulatory Visit (HOSPITAL_COMMUNITY): Payer: Self-pay

## 2022-04-16 ENCOUNTER — Other Ambulatory Visit (HOSPITAL_COMMUNITY): Payer: Self-pay

## 2022-04-23 ENCOUNTER — Ambulatory Visit (INDEPENDENT_AMBULATORY_CARE_PROVIDER_SITE_OTHER): Payer: 59 | Admitting: Pharmacist

## 2022-04-23 ENCOUNTER — Other Ambulatory Visit: Payer: Self-pay

## 2022-04-23 ENCOUNTER — Other Ambulatory Visit (HOSPITAL_COMMUNITY): Payer: Self-pay

## 2022-04-23 DIAGNOSIS — B20 Human immunodeficiency virus [HIV] disease: Secondary | ICD-10-CM

## 2022-04-23 NOTE — Progress Notes (Signed)
Virtual Visit via Telephone Note  I connected withNAME@ on 04/23/22 at  3:15 PM EDT by telephone and verified that I am speaking with the correct person using two identifiers.  Location: Patient: Home Provider: Home office   I discussed the limitations, risks, security and privacy concerns of performing an evaluation and management service by telephone and the availability of in person appointments. I also discussed with the patient that there may be a patient responsible charge related to this service. The patient expressed understanding and agreed to proceed.  Date:  04/23/2022   HPI: [name] is a 56 y.o. male who presents for follow-up of their specialty HIV medication, Triumeq.  There are no problems to display for this patient.   Patient's Medications  New Prescriptions   No medications on file  Previous Medications   ABACAVIR-DOLUTEGRAVIR-LAMIVUDINE (TRIUMEQ) 600-50-300 MG TABLET    Take 1 tablet by mouth daily.   ACETAMINOPHEN (TYLENOL) 500 MG TABLET    Take 1,000 mg by mouth as needed. For pain   LISINOPRIL (ZESTRIL) 10 MG TABLET    TAKE 1 TABLET BY MOUTH ONCE A DAY   LISINOPRIL (ZESTRIL) 10 MG TABLET    Take 1 tablet (10 mg total) by mouth daily.   NAPHAZOLINE-PHENIRAMINE (NAPHCON-A) 0.025-0.3 % OPHTHALMIC SOLUTION    Place 1 drop into both eyes as needed. For dry eyes.    OXYCODONE (OXYCONTIN) 10 MG 12 HR TABLET    2 tabs po q12h x 3 days then 1 tab po q12h x 3 days.  Modified Medications   No medications on file  Discontinued Medications   No medications on file    Allergies: Allergies  Allergen Reactions   No Known Allergies     Past Medical History: Past Medical History:  Diagnosis Date   Arthritis    Blood dyscrasia    hemophiliac   PONV (postoperative nausea and vomiting)    no probable 1/13    Social History: Social History   Socioeconomic History   Marital status: Married    Spouse name: Not on file   Number of children: Not on file    Years of education: Not on file   Highest education level: Not on file  Occupational History   Not on file  Tobacco Use   Smoking status: Never   Smokeless tobacco: Not on file  Substance and Sexual Activity   Alcohol use: No   Drug use: No   Sexual activity: Not on file  Other Topics Concern   Not on file  Social History Narrative   Not on file   Social Determinants of Health   Financial Resource Strain: Not on file  Food Insecurity: Not on file  Transportation Needs: Not on file  Physical Activity: Not on file  Stress: Not on file  Social Connections: Not on file        No data to display          Labs:  SCr: Lab Results  Component Value Date   CREATININE 1.16 01/09/2017   CREATININE 0.86 07/23/2011   HIV No results found for: "HIV" Hepatitis B No results found for: "HEPBSAB", "HEPBSAG", "HEPBCAB" Hepatitis C No results found for: "HEPCAB", "HCVRNAPCRQN" Hepatitis A No results found for: "HAV" RPR and STI No results found for: "LABRPR", "RPRTITER"      No data to display          Assessment: I spoke with [name] today regarding their specialty medication, Triumeq for HIV treatment.  Patient takes it every day without any issues or missed doses. No problems with adverse effects or tolerability. No problems getting [facility]. Updated/reviewed medication list - no drug interactions or medication changes. All questions answered. Will follow up in 1 year.  Plan: - Continue Triumeq - Follow up in 1 year  I discussed the assessment and treatment plan with the patient. The patient was provided an opportunity to ask questions and all were answered. The patient agreed with the plan and demonstrated an understanding of the instructions.   The patient was advised to call back or seek an in-person evaluation if the symptoms worsen or if the condition fails to improve as anticipated.  I provided 10 minutes of non-face-to-face time  during this encounter.  Alfonse Spruce, PharmD, CPP, [place] Clinical Pharmacist Practitioner Infectious [place] for Infectious Disease

## 2022-05-17 ENCOUNTER — Other Ambulatory Visit (HOSPITAL_COMMUNITY): Payer: Self-pay

## 2022-05-18 ENCOUNTER — Other Ambulatory Visit (HOSPITAL_COMMUNITY): Payer: Self-pay

## 2022-05-22 ENCOUNTER — Other Ambulatory Visit (HOSPITAL_COMMUNITY): Payer: Self-pay

## 2022-06-12 ENCOUNTER — Other Ambulatory Visit (HOSPITAL_COMMUNITY): Payer: Self-pay

## 2022-06-16 ENCOUNTER — Other Ambulatory Visit (HOSPITAL_COMMUNITY): Payer: Self-pay

## 2022-06-18 ENCOUNTER — Other Ambulatory Visit (HOSPITAL_COMMUNITY): Payer: Self-pay

## 2022-06-19 ENCOUNTER — Other Ambulatory Visit (HOSPITAL_COMMUNITY): Payer: Self-pay

## 2022-07-05 ENCOUNTER — Other Ambulatory Visit (HOSPITAL_COMMUNITY): Payer: Self-pay

## 2022-07-09 ENCOUNTER — Other Ambulatory Visit (HOSPITAL_COMMUNITY): Payer: Self-pay

## 2022-07-09 DIAGNOSIS — Z125 Encounter for screening for malignant neoplasm of prostate: Secondary | ICD-10-CM | POA: Diagnosis not present

## 2022-07-09 DIAGNOSIS — B182 Chronic viral hepatitis C: Secondary | ICD-10-CM | POA: Diagnosis not present

## 2022-07-09 DIAGNOSIS — E782 Mixed hyperlipidemia: Secondary | ICD-10-CM | POA: Diagnosis not present

## 2022-07-09 DIAGNOSIS — B20 Human immunodeficiency virus [HIV] disease: Secondary | ICD-10-CM | POA: Diagnosis not present

## 2022-07-09 DIAGNOSIS — I10 Essential (primary) hypertension: Secondary | ICD-10-CM | POA: Diagnosis not present

## 2022-07-09 DIAGNOSIS — Z23 Encounter for immunization: Secondary | ICD-10-CM | POA: Diagnosis not present

## 2022-07-09 DIAGNOSIS — Z139 Encounter for screening, unspecified: Secondary | ICD-10-CM | POA: Diagnosis not present

## 2022-07-09 DIAGNOSIS — D67 Hereditary factor IX deficiency: Secondary | ICD-10-CM | POA: Diagnosis not present

## 2022-07-09 MED ORDER — TRIUMEQ 600-50-300 MG PO TABS: 1.0000 | ORAL_TABLET | Freq: Every day | ORAL | 10 refills | Status: DC

## 2022-07-09 MED ORDER — LISINOPRIL 10 MG PO TABS
10.0000 mg | ORAL_TABLET | Freq: Every day | ORAL | 11 refills | Status: DC
  Filled 2022-07-09 – 2022-07-20 (×2): qty 30, 30d supply, fill #0
  Filled 2022-08-06: qty 30, 30d supply, fill #1
  Filled 2022-08-29: qty 30, 30d supply, fill #2
  Filled 2022-11-27: qty 30, 30d supply, fill #3
  Filled 2023-01-21 – 2023-02-19 (×2): qty 30, 30d supply, fill #4
  Filled 2023-03-21: qty 30, 30d supply, fill #5
  Filled 2023-04-12: qty 30, 30d supply, fill #6
  Filled 2023-05-08: qty 30, 30d supply, fill #7

## 2022-07-09 MED ORDER — LISINOPRIL 10 MG PO TABS
10.0000 mg | ORAL_TABLET | Freq: Every day | ORAL | 11 refills | Status: AC
  Filled 2022-07-09 – 2022-10-02 (×2): qty 30, 30d supply, fill #0
  Filled 2022-11-01: qty 30, 30d supply, fill #1

## 2022-07-10 ENCOUNTER — Other Ambulatory Visit (HOSPITAL_COMMUNITY): Payer: Self-pay

## 2022-07-10 ENCOUNTER — Other Ambulatory Visit: Payer: Self-pay | Admitting: Pharmacist

## 2022-07-10 DIAGNOSIS — B20 Human immunodeficiency virus [HIV] disease: Secondary | ICD-10-CM

## 2022-07-10 MED ORDER — TRIUMEQ 600-50-300 MG PO TABS
1.0000 | ORAL_TABLET | Freq: Every day | ORAL | 10 refills | Status: DC
  Filled 2022-07-10: qty 30, 30d supply, fill #0
  Filled 2022-08-06: qty 30, 30d supply, fill #1
  Filled 2022-08-29: qty 30, 30d supply, fill #2
  Filled 2022-10-02: qty 30, 30d supply, fill #3
  Filled 2022-11-01: qty 30, 30d supply, fill #4
  Filled 2022-11-27: qty 30, 30d supply, fill #5
  Filled 2022-12-26: qty 30, 30d supply, fill #6
  Filled 2023-01-21: qty 30, 30d supply, fill #7
  Filled 2023-02-19: qty 30, 30d supply, fill #8
  Filled 2023-03-21: qty 30, 30d supply, fill #9
  Filled 2023-04-12: qty 30, 30d supply, fill #10

## 2022-07-10 NOTE — Progress Notes (Signed)
Employee rewrite   Alfonse Spruce, PharmD, CPP, BCIDP, [place] Clinical Pharmacist Practitioner Infectious [place] for Infectious Disease

## 2022-07-11 ENCOUNTER — Other Ambulatory Visit: Payer: Self-pay

## 2022-07-11 ENCOUNTER — Other Ambulatory Visit (HOSPITAL_COMMUNITY): Payer: Self-pay

## 2022-07-17 ENCOUNTER — Other Ambulatory Visit: Payer: Self-pay

## 2022-07-20 ENCOUNTER — Other Ambulatory Visit (HOSPITAL_COMMUNITY): Payer: Self-pay

## 2022-07-23 ENCOUNTER — Other Ambulatory Visit (HOSPITAL_COMMUNITY): Payer: Self-pay

## 2022-07-23 MED ORDER — LISINOPRIL 10 MG PO TABS
15.0000 mg | ORAL_TABLET | Freq: Every day | ORAL | 0 refills | Status: AC
  Filled 2022-07-23 – 2023-01-04 (×2): qty 90, 60d supply, fill #0

## 2022-07-24 ENCOUNTER — Other Ambulatory Visit: Payer: Self-pay

## 2022-07-25 ENCOUNTER — Other Ambulatory Visit (HOSPITAL_COMMUNITY): Payer: Self-pay

## 2022-08-06 ENCOUNTER — Other Ambulatory Visit (HOSPITAL_COMMUNITY): Payer: Self-pay

## 2022-08-13 ENCOUNTER — Other Ambulatory Visit (HOSPITAL_COMMUNITY): Payer: Self-pay

## 2022-08-21 ENCOUNTER — Other Ambulatory Visit (HOSPITAL_COMMUNITY): Payer: Self-pay

## 2022-08-21 MED ORDER — TRIUMEQ 600-50-300 MG PO TABS
1.0000 | ORAL_TABLET | Freq: Every day | ORAL | 11 refills | Status: DC
  Filled 2023-01-04: qty 30, 30d supply, fill #0

## 2022-08-29 ENCOUNTER — Other Ambulatory Visit (HOSPITAL_COMMUNITY): Payer: Self-pay

## 2022-09-06 ENCOUNTER — Other Ambulatory Visit: Payer: Self-pay

## 2022-10-02 ENCOUNTER — Other Ambulatory Visit (HOSPITAL_COMMUNITY): Payer: Self-pay

## 2022-10-02 ENCOUNTER — Other Ambulatory Visit: Payer: Self-pay

## 2022-10-09 ENCOUNTER — Other Ambulatory Visit: Payer: Self-pay

## 2022-10-09 ENCOUNTER — Other Ambulatory Visit (HOSPITAL_COMMUNITY): Payer: Self-pay

## 2022-10-10 ENCOUNTER — Other Ambulatory Visit (HOSPITAL_COMMUNITY): Payer: Self-pay

## 2022-10-10 ENCOUNTER — Other Ambulatory Visit: Payer: Self-pay

## 2022-11-01 ENCOUNTER — Other Ambulatory Visit (HOSPITAL_COMMUNITY): Payer: Self-pay

## 2022-11-02 ENCOUNTER — Other Ambulatory Visit (HOSPITAL_COMMUNITY): Payer: Self-pay

## 2022-11-02 ENCOUNTER — Other Ambulatory Visit: Payer: Self-pay

## 2022-11-03 ENCOUNTER — Other Ambulatory Visit (HOSPITAL_COMMUNITY): Payer: Self-pay

## 2022-11-05 ENCOUNTER — Other Ambulatory Visit: Payer: Self-pay

## 2022-11-05 ENCOUNTER — Other Ambulatory Visit (HOSPITAL_COMMUNITY): Payer: Self-pay

## 2022-11-06 ENCOUNTER — Other Ambulatory Visit (HOSPITAL_COMMUNITY): Payer: Self-pay

## 2022-11-06 ENCOUNTER — Other Ambulatory Visit: Payer: Self-pay

## 2022-11-27 ENCOUNTER — Other Ambulatory Visit (HOSPITAL_COMMUNITY): Payer: Self-pay

## 2022-11-30 ENCOUNTER — Other Ambulatory Visit: Payer: Self-pay

## 2022-12-26 ENCOUNTER — Other Ambulatory Visit: Payer: Self-pay

## 2022-12-27 ENCOUNTER — Other Ambulatory Visit: Payer: Self-pay

## 2022-12-27 ENCOUNTER — Other Ambulatory Visit (HOSPITAL_COMMUNITY): Payer: Self-pay

## 2023-01-04 ENCOUNTER — Other Ambulatory Visit: Payer: Self-pay

## 2023-01-21 ENCOUNTER — Other Ambulatory Visit: Payer: Self-pay

## 2023-01-21 ENCOUNTER — Other Ambulatory Visit (HOSPITAL_COMMUNITY): Payer: Self-pay

## 2023-01-22 ENCOUNTER — Other Ambulatory Visit (HOSPITAL_COMMUNITY): Payer: Self-pay

## 2023-01-25 ENCOUNTER — Other Ambulatory Visit: Payer: Self-pay

## 2023-01-25 ENCOUNTER — Other Ambulatory Visit (HOSPITAL_COMMUNITY): Payer: Self-pay

## 2023-02-19 ENCOUNTER — Other Ambulatory Visit (HOSPITAL_COMMUNITY): Payer: Self-pay

## 2023-02-22 ENCOUNTER — Other Ambulatory Visit: Payer: Self-pay

## 2023-02-25 ENCOUNTER — Other Ambulatory Visit (HOSPITAL_COMMUNITY): Payer: Self-pay

## 2023-03-21 ENCOUNTER — Other Ambulatory Visit (HOSPITAL_COMMUNITY): Payer: Self-pay

## 2023-03-22 ENCOUNTER — Other Ambulatory Visit (HOSPITAL_COMMUNITY): Payer: Self-pay

## 2023-04-10 ENCOUNTER — Telehealth: Payer: Self-pay | Admitting: Pharmacist

## 2023-04-10 NOTE — Telephone Encounter (Signed)
Called patient to discuss HIV medication, Triumeq, for [employer] Employee Specialty Medication Program. No answer, left HIPAA compliant VM.  Margarite Gouge, PharmD, CPP, BCIDP, AAHIVP Clinical Pharmacist Practitioner Infectious Diseases Clinical Pharmacist [facility] for Infectious Disease

## 2023-04-11 ENCOUNTER — Other Ambulatory Visit: Payer: Self-pay

## 2023-04-11 ENCOUNTER — Ambulatory Visit (INDEPENDENT_AMBULATORY_CARE_PROVIDER_SITE_OTHER): Payer: Self-pay | Admitting: Pharmacist

## 2023-04-11 DIAGNOSIS — B20 Human immunodeficiency virus [HIV] disease: Secondary | ICD-10-CM

## 2023-04-11 NOTE — Progress Notes (Signed)
Virtual Visit via Telephone Note  I connected with  [name] on 04/11/23 at  4:00 PM EDT by telephone and verified that I am speaking with the correct person using two identifiers.  Location: Patient: Home Provider: Office   I discussed the limitations, risks, security and privacy concerns of performing an evaluation and management service by telephone and the availability of in person appointments. I also discussed with the patient that there may be a patient responsible charge related to this service. The patient expressed understanding and agreed to proceed.  Date:  04/11/2023   HPI: [name] is a 57 y.o. male who presents for follow-up of their specialty HIV medication, Triumeq.  There are no problems to display for this patient.   Patient's Medications  New Prescriptions   No medications on file  Previous Medications   ABACAVIR-DOLUTEGRAVIR-LAMIVUDINE (TRIUMEQ) 600-50-300 MG TABLET    Take 1 tablet by mouth daily.   ABACAVIR-DOLUTEGRAVIR-LAMIVUDINE (TRIUMEQ) 600-50-300 MG TABLET    Take 1 tablet by mouth daily.   ACETAMINOPHEN (TYLENOL) 500 MG TABLET    Take 1,000 mg by mouth as needed. For pain   LISINOPRIL (ZESTRIL) 10 MG TABLET    TAKE 1 TABLET BY MOUTH ONCE A DAY   LISINOPRIL (ZESTRIL) 10 MG TABLET    Take 1 tablet (10 mg total) by mouth daily.   LISINOPRIL (ZESTRIL) 10 MG TABLET    Take 1 tablet (10 mg total) by mouth daily.   LISINOPRIL (ZESTRIL) 10 MG TABLET    Take 1.5 tablets (15 mg total) by mouth daily.   NAPHAZOLINE-PHENIRAMINE (NAPHCON-A) 0.025-0.3 % OPHTHALMIC SOLUTION    Place 1 drop into both eyes as needed. For dry eyes.    OXYCODONE (OXYCONTIN) 10 MG 12 HR TABLET    2 tabs po q12h x 3 days then 1 tab po q12h x 3 days.  Modified Medications   No medications on file  Discontinued Medications   No medications on file    Allergies: Allergies  Allergen Reactions   No Known Allergies     Past Medical History: Past Medical History:  Diagnosis  Date   Arthritis    Blood dyscrasia    hemophiliac   PONV (postoperative nausea and vomiting)    no probable 1/13    Social History: Social History   Socioeconomic History   Marital status: Married    Spouse name: Not on file   Number of children: Not on file   Years of education: Not on file   Highest education level: Not on file  Occupational History   Not on file  Tobacco Use   Smoking status: Never   Smokeless tobacco: Not on file  Substance and Sexual Activity   Alcohol use: No   Drug use: No   Sexual activity: Not on file  Other Topics Concern   Not on file  Social History Narrative   Not on file   Social Determinants of Health   Financial Resource Strain: Not on file  Food Insecurity: Not on file  Transportation Needs: Not on file  Physical Activity: Not on file  Stress: Not on file  Social Connections: Not on file        No data to display          Labs:  SCr: Lab Results  Component Value Date   CREATININE 1.16 01/09/2017   CREATININE 0.86 07/23/2011   HIV No results found for: "HIV" Hepatitis B No results found for: "HEPBSAB", "HEPBSAG", "  HEPBCAB" Hepatitis C No results found for: "HEPCAB", "HCVRNAPCRQN" Hepatitis A No results found for: "HAV" RPR and STI No results found for: "LABRPR", "RPRTITER"      No data to display          Assessment: I spoke with Rolm Gala today regarding their specialty medication, Triumeq for HIV treatment. Patient takes it every day without any issues or missed doses. No problems with adverse effects or tolerability. No problems getting it from [facility] Updated/reviewed medication list - no drug interactions. All questions answered. Will follow up in 1 year.  Plan: - Continue Triumeq - Follow up in 1 year  I discussed the assessment and treatment plan with the patient. The patient was provided an opportunity to ask questions and all were answered. The patient agreed with the plan  and demonstrated an understanding of the instructions.   The patient was advised to call back or seek an in-person evaluation if the symptoms worsen or if the condition fails to improve as anticipated.  I provided 10 minutes of non-face-to-face time during this encounter.  Margarite Gouge, PharmD, CPP, BCIDP, AAHIVP Clinical Pharmacist Practitioner Infectious Diseases Clinical Pharmacist [facility] for Infectious Disease

## 2023-04-12 ENCOUNTER — Other Ambulatory Visit (HOSPITAL_COMMUNITY): Payer: Self-pay

## 2023-04-13 ENCOUNTER — Encounter (HOSPITAL_COMMUNITY): Payer: Self-pay

## 2023-04-16 ENCOUNTER — Other Ambulatory Visit: Payer: Self-pay

## 2023-04-16 ENCOUNTER — Other Ambulatory Visit (HOSPITAL_COMMUNITY): Payer: Self-pay

## 2023-05-02 ENCOUNTER — Other Ambulatory Visit: Payer: Self-pay

## 2023-05-02 ENCOUNTER — Other Ambulatory Visit: Payer: Self-pay | Admitting: Pharmacist

## 2023-05-02 ENCOUNTER — Other Ambulatory Visit (HOSPITAL_COMMUNITY): Payer: Self-pay

## 2023-05-02 MED ORDER — TRIUMEQ 600-50-300 MG PO TABS
1.0000 | ORAL_TABLET | Freq: Every day | ORAL | 11 refills | Status: DC
  Filled 2023-05-02 – 2023-05-08 (×2): qty 30, 30d supply, fill #0
  Filled 2023-06-04 – 2023-06-13 (×2): qty 30, 30d supply, fill #1

## 2023-05-08 ENCOUNTER — Other Ambulatory Visit: Payer: Self-pay

## 2023-05-08 ENCOUNTER — Other Ambulatory Visit (HOSPITAL_COMMUNITY): Payer: Self-pay | Admitting: Pharmacy Technician

## 2023-05-08 ENCOUNTER — Other Ambulatory Visit (HOSPITAL_COMMUNITY): Payer: Self-pay

## 2023-05-08 NOTE — Progress Notes (Signed)
Specialty Pharmacy Refill Coordination Note  [name] is a 57 y.o. male contacted today regarding refills of specialty medication(s) Abacavir-Dolutegravir-Lamivud   Patient requested Delivery   Delivery date: 05/15/23 05/14/23   Verified address: [address]  [place]   Medication will be filled on 05/12/21.

## 2023-05-13 ENCOUNTER — Other Ambulatory Visit: Payer: Self-pay

## 2023-05-16 ENCOUNTER — Other Ambulatory Visit: Payer: Self-pay

## 2023-05-24 ENCOUNTER — Other Ambulatory Visit: Payer: Self-pay

## 2023-06-04 ENCOUNTER — Other Ambulatory Visit (HOSPITAL_COMMUNITY): Payer: Self-pay

## 2023-06-04 ENCOUNTER — Other Ambulatory Visit: Payer: Self-pay

## 2023-06-04 NOTE — Progress Notes (Signed)
Specialty Pharmacy Ongoing Clinical Assessment Note  [name] is a 57 y.o. male who is being followed by the specialty pharmacy service for RxSp HIV   Patient's specialty medication(s) reviewed today: Abacavir-Dolutegravir-Lamivud   Missed doses in the last 4 weeks: 0   Patient/Caregiver did not have any additional questions or concerns.   Therapeutic benefit summary: Patient is achieving benefit   Adverse events/side effects summary: No adverse events/side effects   Patient's therapy is appropriate to: Continue    Goals Addressed             This Visit's Progress    Achieve Undetectable HIV Viral Load < 20       Patient is on track. Patient will maintain adherence.  Last viral load remained undetectable.          Follow up:  6 months  Servando Snare Specialty Pharmacist

## 2023-06-04 NOTE — Progress Notes (Signed)
Specialty Pharmacy Refill Coordination Note  [name] is a 57 y.o. male contacted today regarding refills of specialty medication(s) Abacavir-Dolutegravir-Lamivud   Patient requested Delivery   Delivery date: 06/14/23   Verified address: [address]  [place]   Medication will be filled on 06/13/23.

## 2023-06-13 ENCOUNTER — Other Ambulatory Visit (HOSPITAL_COMMUNITY): Payer: Self-pay

## 2023-06-13 ENCOUNTER — Other Ambulatory Visit: Payer: Self-pay

## 2023-06-14 ENCOUNTER — Other Ambulatory Visit (HOSPITAL_COMMUNITY): Payer: Self-pay

## 2023-06-18 ENCOUNTER — Other Ambulatory Visit: Payer: Self-pay

## 2023-06-18 ENCOUNTER — Other Ambulatory Visit (HOSPITAL_COMMUNITY): Payer: Self-pay

## 2023-06-18 MED ORDER — DOVATO 50-300 MG PO TABS
1.0000 | ORAL_TABLET | Freq: Every day | ORAL | 3 refills | Status: DC
  Filled 2023-06-18: qty 30, 30d supply, fill #0
  Filled 2023-07-05: qty 30, 30d supply, fill #1
  Filled 2023-08-06 (×2): qty 30, 30d supply, fill #2

## 2023-06-18 NOTE — Progress Notes (Signed)
Medication switched to [facility] Specialty Pharmacy Initial Fill Coordination Note  [name] is a 57 y.o. male contacted today regarding initial fill of specialty medication(s) Dolutegravir-Lamivudine   Patient requested Delivery   Delivery date: 06/21/23   Verified address: [address]   Medication will be filled on 11/27.   Patient is aware of $0 copayment.

## 2023-07-05 ENCOUNTER — Other Ambulatory Visit: Payer: Self-pay

## 2023-07-05 NOTE — Progress Notes (Signed)
Specialty Pharmacy Refill Coordination Note  [name] is a 57 y.o. male contacted today regarding refills of specialty medication(s) Dolutegravir-lamiVUDine (Dovato)   Patient requested Delivery   Delivery date: 07/16/23   Verified address: [address]  [place] [address]   Medication will be filled on 07/15/23.

## 2023-07-15 ENCOUNTER — Other Ambulatory Visit: Payer: Self-pay

## 2023-07-29 ENCOUNTER — Other Ambulatory Visit: Payer: Self-pay | Admitting: Pharmacist

## 2023-07-29 ENCOUNTER — Other Ambulatory Visit (HOSPITAL_COMMUNITY): Payer: Self-pay

## 2023-07-29 DIAGNOSIS — Z125 Encounter for screening for malignant neoplasm of prostate: Secondary | ICD-10-CM | POA: Diagnosis not present

## 2023-07-29 DIAGNOSIS — B20 Human immunodeficiency virus [HIV] disease: Secondary | ICD-10-CM | POA: Diagnosis not present

## 2023-07-29 DIAGNOSIS — E782 Mixed hyperlipidemia: Secondary | ICD-10-CM | POA: Diagnosis not present

## 2023-07-29 DIAGNOSIS — Z23 Encounter for immunization: Secondary | ICD-10-CM | POA: Diagnosis not present

## 2023-07-29 DIAGNOSIS — Z13220 Encounter for screening for lipoid disorders: Secondary | ICD-10-CM | POA: Diagnosis not present

## 2023-07-29 DIAGNOSIS — Z9189 Other specified personal risk factors, not elsewhere classified: Secondary | ICD-10-CM | POA: Diagnosis not present

## 2023-07-29 DIAGNOSIS — I10 Essential (primary) hypertension: Secondary | ICD-10-CM | POA: Diagnosis not present

## 2023-07-29 DIAGNOSIS — Z79899 Other long term (current) drug therapy: Secondary | ICD-10-CM | POA: Diagnosis not present

## 2023-07-29 DIAGNOSIS — B182 Chronic viral hepatitis C: Secondary | ICD-10-CM | POA: Diagnosis not present

## 2023-07-29 MED ORDER — DOVATO 50-300 MG PO TABS: 1.0000 | ORAL_TABLET | Freq: Every day | ORAL | 3 refills | Status: DC

## 2023-07-29 MED ORDER — DOVATO 50-300 MG PO TABS
1.0000 | ORAL_TABLET | Freq: Every day | ORAL | 3 refills | Status: DC
  Filled 2023-07-29: qty 90, 90d supply, fill #0
  Filled 2023-08-30: qty 30, 30d supply, fill #0
  Filled 2023-09-25: qty 30, 30d supply, fill #1
  Filled 2023-10-29: qty 30, 30d supply, fill #2
  Filled 2023-11-26: qty 30, 30d supply, fill #3
  Filled 2023-12-27: qty 30, 30d supply, fill #4
  Filled 2024-01-23 – 2024-01-27 (×2): qty 30, 30d supply, fill #5
  Filled 2024-02-13 – 2024-02-27 (×2): qty 30, 30d supply, fill #6
  Filled 2024-03-25: qty 30, 30d supply, fill #7
  Filled 2024-04-23 – 2024-04-27 (×2): qty 30, 30d supply, fill #8
  Filled 2024-05-21 – 2024-05-22 (×2): qty 30, 30d supply, fill #9
  Filled 2024-06-22: qty 30, 30d supply, fill #10
  Filled 2024-07-17: qty 30, 30d supply, fill #11

## 2023-07-30 ENCOUNTER — Other Ambulatory Visit (HOSPITAL_COMMUNITY): Payer: Self-pay

## 2023-07-30 ENCOUNTER — Other Ambulatory Visit: Payer: Self-pay

## 2023-07-30 MED ORDER — LISINOPRIL 20 MG PO TABS
20.0000 mg | ORAL_TABLET | Freq: Every day | ORAL | 11 refills | Status: DC
  Filled 2023-07-30: qty 30, 30d supply, fill #0
  Filled 2023-08-23: qty 30, 30d supply, fill #1
  Filled 2023-09-18: qty 30, 30d supply, fill #2
  Filled 2023-10-24: qty 30, 30d supply, fill #3
  Filled 2023-11-19: qty 30, 30d supply, fill #4
  Filled 2023-12-09 – 2023-12-18 (×2): qty 30, 30d supply, fill #5
  Filled 2024-01-17: qty 30, 30d supply, fill #6
  Filled 2024-02-13: qty 30, 30d supply, fill #7
  Filled 2024-02-27: qty 30, 30d supply, fill #8
  Filled 2024-03-26 – 2024-04-07 (×2): qty 30, 30d supply, fill #9
  Filled 2024-04-27 – 2024-05-04 (×2): qty 30, 30d supply, fill #10
  Filled 2024-05-22 – 2024-06-02 (×2): qty 30, 30d supply, fill #11

## 2023-07-30 MED ORDER — LISINOPRIL 10 MG PO TABS
10.0000 mg | ORAL_TABLET | Freq: Every day | ORAL | 11 refills | Status: DC
  Filled 2023-07-30: qty 30, 30d supply, fill #0
  Filled 2023-08-23: qty 30, 30d supply, fill #1
  Filled 2023-09-18: qty 30, 30d supply, fill #2
  Filled 2023-10-24: qty 30, 30d supply, fill #3
  Filled 2023-11-19: qty 30, 30d supply, fill #4
  Filled 2023-12-09 – 2023-12-18 (×2): qty 30, 30d supply, fill #5
  Filled 2024-01-17: qty 30, 30d supply, fill #6
  Filled 2024-02-13: qty 30, 30d supply, fill #7
  Filled 2024-02-27 – 2024-03-09 (×2): qty 30, 30d supply, fill #8
  Filled 2024-03-26 – 2024-04-02 (×2): qty 30, 30d supply, fill #9
  Filled 2024-04-27 – 2024-05-04 (×2): qty 30, 30d supply, fill #10
  Filled 2024-06-02: qty 30, 30d supply, fill #11

## 2023-08-06 ENCOUNTER — Other Ambulatory Visit: Payer: Self-pay

## 2023-08-06 NOTE — Progress Notes (Signed)
 Specialty Pharmacy Refill Coordination Note  LARONE KLIETHERMES is a 58 y.o. male contacted today regarding refills of specialty medication(s) Dolutegravir -lamiVUDine  (Dovato )   Patient requested Delivery   Delivery date: 08/12/23   Verified address: [address]   [place] [address]   Medication will be filled on 08/09/23.

## 2023-08-09 ENCOUNTER — Other Ambulatory Visit: Payer: Self-pay

## 2023-08-12 ENCOUNTER — Other Ambulatory Visit (HOSPITAL_COMMUNITY): Payer: Self-pay

## 2023-08-13 ENCOUNTER — Other Ambulatory Visit: Payer: Self-pay

## 2023-08-13 ENCOUNTER — Other Ambulatory Visit: Payer: Self-pay | Admitting: Pharmacist

## 2023-08-14 ENCOUNTER — Other Ambulatory Visit (HOSPITAL_COMMUNITY): Payer: Self-pay

## 2023-08-15 ENCOUNTER — Other Ambulatory Visit (HOSPITAL_COMMUNITY): Payer: Self-pay

## 2023-08-16 ENCOUNTER — Other Ambulatory Visit: Payer: Self-pay

## 2023-08-23 ENCOUNTER — Other Ambulatory Visit: Payer: Self-pay

## 2023-08-30 ENCOUNTER — Other Ambulatory Visit (HOSPITAL_COMMUNITY): Payer: Self-pay | Admitting: Pharmacy Technician

## 2023-08-30 ENCOUNTER — Other Ambulatory Visit (HOSPITAL_COMMUNITY): Payer: Self-pay

## 2023-08-30 ENCOUNTER — Other Ambulatory Visit: Payer: Self-pay

## 2023-08-30 NOTE — Progress Notes (Signed)
 Specialty Pharmacy Refill Coordination Note  [name] is a 58 y.o. male contacted today regarding refills of specialty medication(s) Dolutegravir -lamiVUDine  (Dovato )   Patient requested Delivery   Delivery date: 09/05/23   Verified address: [address] [place], [place]   Medication will be filled on 09/04/23.

## 2023-09-04 ENCOUNTER — Other Ambulatory Visit: Payer: Self-pay

## 2023-09-18 ENCOUNTER — Other Ambulatory Visit (HOSPITAL_COMMUNITY): Payer: Self-pay

## 2023-09-18 ENCOUNTER — Other Ambulatory Visit: Payer: Self-pay

## 2023-09-18 DIAGNOSIS — D67 Hereditary factor IX deficiency: Secondary | ICD-10-CM | POA: Diagnosis not present

## 2023-09-18 MED ORDER — TRANEXAMIC ACID 650 MG PO TABS
1300.0000 mg | ORAL_TABLET | Freq: Three times a day (TID) | ORAL | 3 refills | Status: AC
  Filled 2023-09-18 (×2): qty 60, 10d supply, fill #0

## 2023-09-18 MED ORDER — IDELVION 2000 UNITS IV SOLR
INTRAVENOUS | 1 refills | Status: DC
  Filled 2023-09-19: qty 22024, 28d supply, fill #0
  Filled 2023-09-19: qty 21200, 10d supply, fill #0

## 2023-09-19 ENCOUNTER — Other Ambulatory Visit (HOSPITAL_COMMUNITY): Payer: Self-pay

## 2023-09-19 ENCOUNTER — Other Ambulatory Visit: Payer: Self-pay

## 2023-09-20 ENCOUNTER — Ambulatory Visit: Payer: 59 | Attending: Internal Medicine | Admitting: Pharmacist

## 2023-09-20 ENCOUNTER — Other Ambulatory Visit: Payer: Self-pay | Admitting: Pharmacist

## 2023-09-20 ENCOUNTER — Encounter: Payer: Self-pay | Admitting: Pharmacist

## 2023-09-20 ENCOUNTER — Other Ambulatory Visit (HOSPITAL_COMMUNITY): Payer: Self-pay

## 2023-09-20 ENCOUNTER — Other Ambulatory Visit: Payer: Self-pay

## 2023-09-20 ENCOUNTER — Telehealth: Payer: Self-pay | Admitting: Pharmacist

## 2023-09-20 DIAGNOSIS — Z79899 Other long term (current) drug therapy: Secondary | ICD-10-CM

## 2023-09-20 MED ORDER — IDELVION 2000 UNITS IV SOLR
INTRAVENOUS | 1 refills | Status: AC
  Filled 2023-09-20: qty 21200, 10d supply, fill #0

## 2023-09-20 NOTE — Progress Notes (Signed)
 Please see OV from 09/20/2023 for complete documentation.   Butch Penny, PharmD, P[place]y [place], CPP Clinical Pharmacist [facility] [phone]

## 2023-09-20 NOTE — Progress Notes (Signed)
   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 Patient is managed by Dr. Fulton Mole De-Ling Ma for this.    Adherence: no issues noted  Efficacy: has taken prn in the past with no issues.   Drug-drug interactions: none   Monitoring: Factor IX levels: monitored by his specialist per Care Everywhere Inhibitors: monitored by his specialist per Care Everywhere  Hypersensitivity: denies S/sx of thrombosis: denies  Side effects: none reported   O:      Lab Results  Component Value Date   WBC 6.2 09/06/2011   HGB 11.6 (L) 09/06/2011   HCT 34.2 (L) 09/06/2011   MCV 81.0 09/06/2011   PLT 140 (L) 09/06/2011      Chemistry      Component Value Date/Time   NA 140 07/23/2011 0941   K 4.0 07/23/2011 0941   CL 105 07/23/2011 0941   CO2 27 07/23/2011 0941   BUN 16 07/23/2011 0941   CREATININE 1.16 01/09/2017 1339      Component Value Date/Time   CALCIUM 8.8 07/23/2011 0941       A/P: 1. Medication review: Patient currently on Idelvion prn for Hemophilia B. Provided patient education including the following: Andres Ege is an antihemophilic agent used for hemophilia B (congential factor IX deficiency) treatment. It replaces deficient clotting factor IX. Hemophilia B, or Christmas disease, is an X-linked inherited disorder of blood coagulation characterized by insufficient or abnormal synthesis of the clotting protein factor IX. Factor IX is a vitamin K-dependent coagulation factor which is synthesized in the liver. Factor IX is activated by factor XIa in the intrinsic coagulation pathway. Activated factor IX (IXa) in combination with factor VII:C activates factor X to Xa, resulting ultimately in the conversion of prothrombin to thrombin and the formation of a fibrin clot. The infusion of exogenous factor IX to replace the deficiency present in hemophilia B temporarily restores hemostasis. Possible adverse effects include headache, oral paresthesia, and obstructive uropathy.  Nephrotic syndrome and thromboembolic events can also occur.  No  recommendations for changes.   Butch Penny, PharmD, [place]y [place], CPP Clinical Pharmacist [facility] [phone]

## 2023-09-20 NOTE — Telephone Encounter (Signed)
 Called patient to schedule an appointment for the [facility] [employer] Specialty Medication Clinic. I was unable to reach the patient so I left a HIPAA-compliant message requesting that the patient return my call.   Butch Penny, PharmD, [place], CPP Clinical Pharmacist [facility] [phone]

## 2023-09-20 NOTE — Progress Notes (Signed)
 Specialty Pharmacy Initial Fill Coordination Note  ZI NEWBURY is a 58 y.o. male contacted today regarding initial fill of specialty medication(s) Coagulation Factor IX (rIX-FP) Andres Ege)   Patient requested Daryll Drown at [facility] date: 09/23/23   Medication will be filled on 3/3.   Patient is aware of $0 copayment.

## 2023-09-23 ENCOUNTER — Other Ambulatory Visit: Payer: Self-pay

## 2023-09-24 ENCOUNTER — Other Ambulatory Visit (HOSPITAL_COMMUNITY): Payer: Self-pay

## 2023-09-25 ENCOUNTER — Other Ambulatory Visit: Payer: Self-pay | Admitting: Pharmacy Technician

## 2023-09-25 ENCOUNTER — Other Ambulatory Visit: Payer: Self-pay

## 2023-09-25 NOTE — Progress Notes (Signed)
 Specialty Pharmacy Refill Coordination Note  [name] is a 58 y.o. male contacted today regarding refills of specialty medication(s) Dolutegravir-lamiVUDine (Dovato)   Patient requested (Patient-Rptd) Delivery   Delivery date: (Patient-Rptd) 10/02/23   Verified address: (Patient-Rptd) [address], [place], [place] [address]   Medication will be filled on 10/01/23.

## 2023-10-23 DIAGNOSIS — D67 Hereditary factor IX deficiency: Secondary | ICD-10-CM | POA: Diagnosis not present

## 2023-10-24 ENCOUNTER — Other Ambulatory Visit: Payer: Self-pay

## 2023-10-24 ENCOUNTER — Other Ambulatory Visit (HOSPITAL_COMMUNITY): Payer: Self-pay

## 2023-10-28 ENCOUNTER — Other Ambulatory Visit (HOSPITAL_COMMUNITY): Payer: Self-pay

## 2023-10-29 ENCOUNTER — Other Ambulatory Visit: Payer: Self-pay

## 2023-10-29 NOTE — Progress Notes (Signed)
 Specialty Pharmacy Refill Coordination Note  VICKIE PONDS is a 58 y.o. male contacted today regarding refills of specialty medication(s) Dolutegravir-lamiVUDine (Dovato)   Patient requested (Patient-Rptd) Delivery   Delivery date: (Patient-Rptd) 11/05/23   Verified address: (Patient-Rptd) [address] [facility] [place] [address]   Medication will be filled on 04.14.25.

## 2023-11-04 ENCOUNTER — Other Ambulatory Visit: Payer: Self-pay

## 2023-11-19 ENCOUNTER — Other Ambulatory Visit (HOSPITAL_COMMUNITY): Payer: Self-pay

## 2023-11-19 ENCOUNTER — Other Ambulatory Visit: Payer: Self-pay

## 2023-11-20 ENCOUNTER — Other Ambulatory Visit: Payer: Self-pay

## 2023-11-26 ENCOUNTER — Other Ambulatory Visit (HOSPITAL_COMMUNITY): Payer: Self-pay

## 2023-11-26 NOTE — Progress Notes (Signed)
 Specialty Pharmacy Refill Coordination Note  [name] is a 58 y.o. male contacted today regarding refills of specialty medication(s) Dolutegravir -lamiVUDine  (Dovato )   Patient requested Delivery   Delivery date: 12/05/23   Verified address: [address] [place] [address]   Medication will be filled on 12/04/23.

## 2023-11-26 NOTE — Progress Notes (Signed)
 Specialty Pharmacy Ongoing Clinical Assessment Note  [name] is a 58 y.o. male who is being followed by the specialty pharmacy service for RxSp HIV   Patient's specialty medication(s) reviewed today: Dolutegravir -lamiVUDine  (Dovato )   Missed doses in the last 4 weeks: 0   Patient/Caregiver did not have any additional questions or concerns.   Therapeutic benefit summary: Patient is achieving benefit   Adverse events/side effects summary: No adverse events/side effects   Patient's therapy is appropriate to: Continue    Goals Addressed             This Visit's Progress    Achieve Undetectable HIV Viral Load < 20   On track    Patient is on track. Patient will maintain adherence.  Last viral load was 21 copies/mL on 07/29/23.      Maintain optimal adherence to therapy   On track    Patient is on track. Patient will maintain adherence         Follow up:  6 months  Malachi Screws Specialty Pharmacist

## 2023-12-09 ENCOUNTER — Other Ambulatory Visit (HOSPITAL_COMMUNITY): Payer: Self-pay

## 2023-12-18 ENCOUNTER — Other Ambulatory Visit: Payer: Self-pay

## 2023-12-27 ENCOUNTER — Encounter (INDEPENDENT_AMBULATORY_CARE_PROVIDER_SITE_OTHER): Payer: Self-pay

## 2023-12-27 ENCOUNTER — Other Ambulatory Visit: Payer: Self-pay

## 2023-12-27 NOTE — Progress Notes (Signed)
 Specialty Pharmacy Refill Coordination Note Specialty Pharmacy Refill Coordination Note  [name] is a 57 y.o. male contacted today regarding refills of specialty medication(s) Dolutegravir -lamiVUDine  (Dovato )   Patient requested Delivery   Delivery date: 01/03/24   Verified address: [address] [place] [address]   Medication will be filled on 01/02/24.

## 2024-01-01 ENCOUNTER — Other Ambulatory Visit: Payer: Self-pay

## 2024-01-02 ENCOUNTER — Other Ambulatory Visit: Payer: Self-pay

## 2024-01-03 ENCOUNTER — Other Ambulatory Visit: Payer: Self-pay

## 2024-01-17 ENCOUNTER — Other Ambulatory Visit: Payer: Self-pay

## 2024-01-21 ENCOUNTER — Other Ambulatory Visit: Payer: Self-pay

## 2024-01-23 ENCOUNTER — Other Ambulatory Visit: Payer: Self-pay

## 2024-01-27 ENCOUNTER — Other Ambulatory Visit: Payer: Self-pay

## 2024-01-27 ENCOUNTER — Other Ambulatory Visit (HOSPITAL_COMMUNITY): Payer: Self-pay

## 2024-01-27 NOTE — Progress Notes (Signed)
 Specialty Pharmacy Refill Coordination Note  Spoke with Maruice Pieroni is a 58 y.o. male contacted today regarding refills of specialty medication(s) Dolutegravir -lamiVUDine  (Dovato )  Patient requested: Delivery   Delivery date: 01/30/24   Verified address: [address]  [place] [address]  Medication will be filled on 01/29/24.

## 2024-01-28 ENCOUNTER — Other Ambulatory Visit: Payer: Self-pay

## 2024-02-13 ENCOUNTER — Other Ambulatory Visit (HOSPITAL_COMMUNITY): Payer: Self-pay

## 2024-02-13 ENCOUNTER — Other Ambulatory Visit: Payer: Self-pay

## 2024-02-18 ENCOUNTER — Other Ambulatory Visit: Payer: Self-pay

## 2024-02-25 ENCOUNTER — Other Ambulatory Visit: Payer: Self-pay

## 2024-02-27 ENCOUNTER — Other Ambulatory Visit: Payer: Self-pay

## 2024-02-27 NOTE — Progress Notes (Signed)
 Specialty Pharmacy Refill Coordination Note  [name] is a 58 y.o. male contacted today regarding refills of specialty medication(s) Dolutegravir -lamiVUDine  (Dovato )   Patient requested Delivery   Delivery date: 03/03/24   Verified address: [address]  [place] [address]   Medication will be filled on 08.11.25.

## 2024-03-02 ENCOUNTER — Other Ambulatory Visit: Payer: Self-pay

## 2024-03-06 ENCOUNTER — Other Ambulatory Visit (HOSPITAL_COMMUNITY): Payer: Self-pay

## 2024-03-06 ENCOUNTER — Other Ambulatory Visit: Payer: Self-pay

## 2024-03-09 ENCOUNTER — Other Ambulatory Visit: Payer: Self-pay

## 2024-03-11 ENCOUNTER — Other Ambulatory Visit: Payer: Self-pay

## 2024-03-25 ENCOUNTER — Encounter (INDEPENDENT_AMBULATORY_CARE_PROVIDER_SITE_OTHER): Payer: Self-pay

## 2024-03-25 ENCOUNTER — Other Ambulatory Visit: Payer: Self-pay | Admitting: Pharmacy Technician

## 2024-03-25 ENCOUNTER — Other Ambulatory Visit: Payer: Self-pay

## 2024-03-25 ENCOUNTER — Other Ambulatory Visit (HOSPITAL_COMMUNITY): Payer: Self-pay

## 2024-03-25 NOTE — Progress Notes (Signed)
 Specialty Pharmacy Refill Coordination Note  [name] is a 58 y.o. male contacted today regarding refills of specialty medication(s) Dolutegravir -lamiVUDine  (Dovato )   Patient requested (Patient-Rptd) Delivery   Delivery date: 04/02/24 Verified address: (Patient-Rptd) [address] [place] [address]   Medication will be filled on 04/01/24.

## 2024-03-26 ENCOUNTER — Other Ambulatory Visit (HOSPITAL_COMMUNITY): Payer: Self-pay

## 2024-04-01 ENCOUNTER — Other Ambulatory Visit: Payer: Self-pay

## 2024-04-02 ENCOUNTER — Other Ambulatory Visit: Payer: Self-pay

## 2024-04-07 ENCOUNTER — Other Ambulatory Visit (HOSPITAL_COMMUNITY): Payer: Self-pay

## 2024-04-08 DIAGNOSIS — D67 Hereditary factor IX deficiency: Secondary | ICD-10-CM | POA: Diagnosis not present

## 2024-04-15 ENCOUNTER — Ambulatory Visit (INDEPENDENT_AMBULATORY_CARE_PROVIDER_SITE_OTHER): Admitting: Pharmacist

## 2024-04-15 ENCOUNTER — Other Ambulatory Visit: Payer: Self-pay

## 2024-04-15 DIAGNOSIS — B20 Human immunodeficiency virus [HIV] disease: Secondary | ICD-10-CM

## 2024-04-15 NOTE — Progress Notes (Signed)
 Virtual Visit via Telephone Note  I connected with  [name] on 04/15/24 at  3:30 PM EDT by telephone and verified that I am speaking with the correct person using two identifiers.  Location: Patient: Home Provider: Office   I discussed the limitations, risks, security and privacy concerns of performing an evaluation and management service by telephone and the availability of in person appointments. I also discussed with the patient that there may be a patient responsible charge related to this service. The patient expressed understanding and agreed to proceed.  Date:  04/15/2024   HPI: [name] is a 58 y.o. male who presents for follow-up of their specialty HIV medication, Dovato .  There are no active problems to display for this patient.   Patient's Medications  New Prescriptions   No medications on file  Previous Medications   ACETAMINOPHEN  (TYLENOL ) 500 MG TABLET    Take 1,000 mg by mouth as needed. For pain   COAGULATION FACTOR IX , RIX-FP, (IDELVION ) 2000 UNITS INJECTION    Idelvion  50u/kg 4195 units (+/- 5%) IV push prior to procedures and q 48 hrs prn bleeding as directed.   DOLUTEGRAVIR -LAMIVUDINE  (DOVATO ) 50-300 MG TABLET    Take 1 tablet by mouth daily.   LISINOPRIL  (ZESTRIL ) 10 MG TABLET    TAKE 1 TABLET BY MOUTH ONCE A DAY   LISINOPRIL  (ZESTRIL ) 10 MG TABLET    Take 1 tablet (10 mg total) by mouth daily.   LISINOPRIL  (ZESTRIL ) 10 MG TABLET    Take 1.5 tablets (15 mg total) by mouth daily.   LISINOPRIL  (ZESTRIL ) 10 MG TABLET    Take 1 tablet (10 mg total) by mouth daily.   LISINOPRIL  (ZESTRIL ) 20 MG TABLET    Take 1 tablet (20 mg total) by mouth daily.   NAPHAZOLINE-PHENIRAMINE (NAPHCON-A) 0.025-0.3 % OPHTHALMIC SOLUTION    Place 1 drop into both eyes as needed. For dry eyes.    OXYCODONE  (OXYCONTIN ) 10 MG 12 HR TABLET    2 tabs po q12h x 3 days then 1 tab po q12h x 3 days.   TRANEXAMIC ACID  (LYSTEDA ) 650 MG TABS TABLET    Take 2 tablets (1,300 mg total) by  mouth every 8 (eight) hours. Start the night before the procedure and continue for 5 days after and then as needed for bleeding as directed.  Modified Medications   No medications on file  Discontinued Medications   No medications on file    Allergies: Allergies  Allergen Reactions   No Known Allergies     Past Medical History: Past Medical History:  Diagnosis Date   Arthritis    Blood dyscrasia    hemophiliac   PONV (postoperative nausea and vomiting)    no probable 1/13    Social History: Social History   Socioeconomic History   Marital status: Married    Spouse name: Not on file   Number of children: Not on file   Years of education: Not on file   Highest education level: Not on file  Occupational History   Not on file  Tobacco Use   Smoking status: Never   Smokeless tobacco: Not on file  Substance and Sexual Activity   Alcohol use: No   Drug use: No   Sexual activity: Not on file  Other Topics Concern   Not on file  Social History Narrative   Not on file   Social Drivers of Health   Financial Resource Strain: Not on file  Food Insecurity: Not  on file  Transportation Needs: Not on file  Physical Activity: Not on file  Stress: Not on file  Social Connections: Not on file        No data to display          Labs:  SCr: Lab Results  Component Value Date   CREATININE 1.16 01/09/2017   CREATININE 0.86 07/23/2011   HIV No results found for: HIV Hepatitis B No results found for: HEPBSAB, HEPBSAG, HEPBCAB Hepatitis C No results found for: HEPCAB, HCVRNAPCRQN Hepatitis A No results found for: HAV RPR and STI No results found for: LABRPR, RPRTITER      No data to display          Assessment: I spoke with Blake today regarding their specialty medication, Dovato  for HIV treatment. Transitioned from Triumeq  to Dovato  earlier this year. Patient takes it every day without any issues or missed doses. No problems with adverse  effects or tolerability. No problems getting it from [facility]. Updated/reviewed medication list - no drug interactions. All questions answered. Will follow up in 1 year.  Plan: - Continue Dovato  - Follow up in 1 year  I discussed the assessment and treatment plan with the patient. The patient was provided an opportunity to ask questions and all were answered. The patient agreed with the plan and demonstrated an understanding of the instructions.   The patient was advised to call back or seek an in-person evaluation if the symptoms worsen or if the condition fails to improve as anticipated.  I provided 15 minutes of non-face-to-face time during this encounter.  Alan Geralds, PharmD, CPP, BCIDP, AAHIVP Clinical Pharmacist Practitioner Infectious Diseases Clinical Pharmacist [facility]

## 2024-04-23 ENCOUNTER — Other Ambulatory Visit: Payer: Self-pay

## 2024-04-27 ENCOUNTER — Other Ambulatory Visit: Payer: Self-pay

## 2024-04-27 NOTE — Progress Notes (Signed)
 Patient Satisfaction Survey compete

## 2024-04-27 NOTE — Progress Notes (Signed)
 Specialty Pharmacy Refill Coordination Note  [name] is a 58 y.o. male contacted today regarding refills of specialty medication(s) Dolutegravir -lamiVUDine  (Dovato )   Patient requested Delivery   Delivery date: 04/29/24   Verified address: [address]   [place] [address]   Medication will be filled on 04/28/24.

## 2024-05-12 ENCOUNTER — Other Ambulatory Visit: Payer: Self-pay

## 2024-05-20 ENCOUNTER — Other Ambulatory Visit: Payer: Self-pay

## 2024-05-21 ENCOUNTER — Other Ambulatory Visit: Payer: Self-pay

## 2024-05-22 ENCOUNTER — Other Ambulatory Visit: Payer: Self-pay

## 2024-05-22 NOTE — Progress Notes (Signed)
 Specialty Pharmacy Refill Coordination Note  [name] is a 58 y.o. male contacted today regarding refills of specialty medication(s) Dolutegravir -lamiVUDine  (Dovato )   Patient requested Delivery   Delivery date: 05/28/24   Verified address: [address]   [place] [address]   Medication will be filled on: 05/27/24

## 2024-05-27 ENCOUNTER — Other Ambulatory Visit: Payer: Self-pay

## 2024-06-02 ENCOUNTER — Other Ambulatory Visit: Payer: Self-pay

## 2024-06-02 ENCOUNTER — Other Ambulatory Visit (HOSPITAL_COMMUNITY): Payer: Self-pay

## 2024-06-15 ENCOUNTER — Other Ambulatory Visit: Payer: Self-pay

## 2024-06-17 ENCOUNTER — Other Ambulatory Visit (HOSPITAL_COMMUNITY): Payer: Self-pay

## 2024-06-19 ENCOUNTER — Other Ambulatory Visit: Payer: Self-pay

## 2024-06-22 ENCOUNTER — Other Ambulatory Visit (HOSPITAL_COMMUNITY): Payer: Self-pay

## 2024-06-24 ENCOUNTER — Other Ambulatory Visit: Payer: Self-pay | Admitting: Pharmacy Technician

## 2024-06-24 ENCOUNTER — Other Ambulatory Visit: Payer: Self-pay

## 2024-06-24 NOTE — Progress Notes (Signed)
 Specialty Pharmacy Refill Coordination Note  [name] is a 58 y.o. male contacted today regarding refills of specialty medication(s)  Dolutegravir -lamiVUDine  (Dovato )   Patient requested (Patient-Rptd) Delivery   Delivery date: 06/26/2024 Verified address: (Patient-Rptd) [address] [place], [place] [address]   Medication will be filled on:06/25/2024

## 2024-06-25 ENCOUNTER — Other Ambulatory Visit: Payer: Self-pay

## 2024-06-30 ENCOUNTER — Other Ambulatory Visit (HOSPITAL_COMMUNITY): Payer: Self-pay

## 2024-06-30 ENCOUNTER — Other Ambulatory Visit: Payer: Self-pay

## 2024-07-01 ENCOUNTER — Other Ambulatory Visit (HOSPITAL_COMMUNITY): Payer: Self-pay

## 2024-07-01 ENCOUNTER — Other Ambulatory Visit: Payer: Self-pay

## 2024-07-01 MED ORDER — LISINOPRIL 10 MG PO TABS
10.0000 mg | ORAL_TABLET | Freq: Every day | ORAL | 4 refills | Status: DC
  Filled 2024-07-01: qty 30, 30d supply, fill #0
  Filled 2024-07-17 – 2024-07-20 (×2): qty 30, 30d supply, fill #1

## 2024-07-17 ENCOUNTER — Other Ambulatory Visit: Payer: Self-pay

## 2024-07-17 ENCOUNTER — Other Ambulatory Visit (HOSPITAL_COMMUNITY): Payer: Self-pay

## 2024-07-17 NOTE — Progress Notes (Signed)
 Specialty Pharmacy Refill Coordination Note  [name] is a 58 y.o. male contacted today regarding refills of specialty medication(s) Dolutegravir -lamiVUDine  (Dovato )   Patient requested Delivery   Delivery date: 07/22/24   Verified address: [address] [place] [address]   Medication will be filled on: 07/21/24

## 2024-07-20 ENCOUNTER — Other Ambulatory Visit (HOSPITAL_COMMUNITY): Payer: Self-pay

## 2024-07-20 ENCOUNTER — Other Ambulatory Visit: Payer: Self-pay

## 2024-07-20 MED ORDER — LISINOPRIL 20 MG PO TABS
20.0000 mg | ORAL_TABLET | Freq: Every day | ORAL | 4 refills | Status: AC
  Filled 2024-07-20: qty 30, 30d supply, fill #0
  Filled 2024-08-18: qty 90, 90d supply, fill #1

## 2024-07-21 ENCOUNTER — Other Ambulatory Visit: Payer: Self-pay

## 2024-07-28 ENCOUNTER — Other Ambulatory Visit: Payer: Self-pay

## 2024-08-13 ENCOUNTER — Other Ambulatory Visit: Payer: Self-pay

## 2024-08-18 ENCOUNTER — Other Ambulatory Visit (HOSPITAL_COMMUNITY): Payer: Self-pay

## 2024-08-18 ENCOUNTER — Other Ambulatory Visit: Payer: Self-pay

## 2024-08-18 ENCOUNTER — Other Ambulatory Visit: Payer: Self-pay | Admitting: Pharmacist

## 2024-08-18 MED ORDER — DOVATO 50-300 MG PO TABS
1.0000 | ORAL_TABLET | Freq: Every day | ORAL | 0 refills | Status: DC
  Filled 2024-08-18: qty 90, 90d supply, fill #0

## 2024-08-18 MED ORDER — DOVATO 50-300 MG PO TABS
1.0000 | ORAL_TABLET | Freq: Every day | ORAL | 0 refills | Status: AC
  Filled 2024-08-18: qty 90, 90d supply, fill #0
  Filled 2024-08-19: qty 30, 30d supply, fill #0

## 2024-08-19 ENCOUNTER — Other Ambulatory Visit: Payer: Self-pay | Admitting: Pharmacy Technician

## 2024-08-19 ENCOUNTER — Other Ambulatory Visit: Payer: Self-pay

## 2024-08-19 NOTE — Progress Notes (Signed)
 Specialty Pharmacy Refill Coordination Note  [name] is a 59 y.o. male contacted today regarding refills of specialty medication(s) Dolutegravir -lamiVUDine  (Dovato )   Patient requested Delivery   Delivery date: 08/21/24   Verified address: [address] [place] [address]   Medication will be filled on: 08/20/24

## 2024-08-20 ENCOUNTER — Other Ambulatory Visit: Payer: Self-pay

## 2024-08-27 ENCOUNTER — Other Ambulatory Visit (HOSPITAL_COMMUNITY): Payer: Self-pay

## 2024-08-27 ENCOUNTER — Other Ambulatory Visit: Payer: Self-pay

## 2024-08-27 NOTE — Progress Notes (Signed)
 Specialty Pharmacy Ongoing Clinical Assessment Note  [name] is a 59 y.o. male who is being followed by the specialty pharmacy service for RxSp HIV   Patient's specialty medication(s) reviewed today: Dolutegravir -lamiVUDine  (Dovato )   Missed doses in the last 4 weeks: 0   Patient/Caregiver did not have any additional questions or concerns.   Therapeutic benefit summary: Patient is achieving benefit   Adverse events/side effects summary: No adverse events/side effects   Patient's therapy is appropriate to: Continue    Goals Addressed             This Visit's Progress    Achieve Undetectable HIV Viral Load < 20   On track    Patient is on track. Patient will maintain adherence.  Last viral load was 21 copies/mL on 07/29/23.         Follow up: 12 months  Silvano LOISE Dolly Specialty Pharmacist
